# Patient Record
Sex: Male | Born: 1960 | Race: White | Hispanic: No | Marital: Married | Smoking: Former smoker
Health system: Southern US, Community
[De-identification: ages and names within clinical notes are randomized; demographics above are authoritative.]

## PROBLEM LIST (undated history)

## (undated) DIAGNOSIS — F419 Anxiety disorder, unspecified: Secondary | ICD-10-CM

## (undated) DIAGNOSIS — Z8619 Personal history of other infectious and parasitic diseases: Secondary | ICD-10-CM

## (undated) DIAGNOSIS — K219 Gastro-esophageal reflux disease without esophagitis: Secondary | ICD-10-CM

## (undated) DIAGNOSIS — E079 Disorder of thyroid, unspecified: Secondary | ICD-10-CM

## (undated) DIAGNOSIS — I10 Essential (primary) hypertension: Secondary | ICD-10-CM

## (undated) DIAGNOSIS — F32A Depression, unspecified: Secondary | ICD-10-CM

## (undated) DIAGNOSIS — E785 Hyperlipidemia, unspecified: Secondary | ICD-10-CM

## (undated) DIAGNOSIS — F329 Major depressive disorder, single episode, unspecified: Secondary | ICD-10-CM

## (undated) DIAGNOSIS — E119 Type 2 diabetes mellitus without complications: Secondary | ICD-10-CM

## (undated) HISTORY — DX: Anxiety disorder, unspecified: F41.9

## (undated) HISTORY — DX: Personal history of other infectious and parasitic diseases: Z86.19

## (undated) HISTORY — DX: Gastro-esophageal reflux disease without esophagitis: K21.9

## (undated) HISTORY — PX: ESOPHAGEAL DILATION: SHX303

## (undated) HISTORY — DX: Depression, unspecified: F32.A

## (undated) HISTORY — DX: Disorder of thyroid, unspecified: E07.9

## (undated) HISTORY — DX: Type 2 diabetes mellitus without complications: E11.9

## (undated) HISTORY — DX: Hyperlipidemia, unspecified: E78.5

## (undated) HISTORY — DX: Essential (primary) hypertension: I10

## (undated) HISTORY — DX: Major depressive disorder, single episode, unspecified: F32.9

---

## 2016-05-03 ENCOUNTER — Other Ambulatory Visit: Payer: Self-pay | Admitting: Internal Medicine

## 2016-05-03 DIAGNOSIS — E041 Nontoxic single thyroid nodule: Secondary | ICD-10-CM

## 2016-05-03 DIAGNOSIS — Z808 Family history of malignant neoplasm of other organs or systems: Secondary | ICD-10-CM

## 2016-05-03 DIAGNOSIS — E049 Nontoxic goiter, unspecified: Secondary | ICD-10-CM

## 2016-05-13 ENCOUNTER — Ambulatory Visit
Admission: RE | Admit: 2016-05-13 | Discharge: 2016-05-13 | Disposition: A | Discharge status: Home / Self Care | Payer: BLUE CROSS/BLUE SHIELD | Source: Ambulatory Visit | Attending: Internal Medicine | Admitting: Internal Medicine

## 2016-05-13 DIAGNOSIS — E041 Nontoxic single thyroid nodule: Secondary | ICD-10-CM

## 2016-05-13 DIAGNOSIS — E049 Nontoxic goiter, unspecified: Secondary | ICD-10-CM

## 2016-05-13 DIAGNOSIS — Z808 Family history of malignant neoplasm of other organs or systems: Secondary | ICD-10-CM

## 2016-05-22 ENCOUNTER — Other Ambulatory Visit: Payer: Self-pay | Admitting: Internal Medicine

## 2016-05-22 DIAGNOSIS — E041 Nontoxic single thyroid nodule: Secondary | ICD-10-CM

## 2016-07-03 ENCOUNTER — Other Ambulatory Visit (HOSPITAL_COMMUNITY)
Admission: RE | Admit: 2016-07-03 | Discharge: 2016-07-03 | Disposition: A | Discharge status: Home / Self Care | Payer: BLUE CROSS/BLUE SHIELD | Source: Ambulatory Visit | Attending: Interventional Radiology | Admitting: Interventional Radiology

## 2016-07-03 ENCOUNTER — Ambulatory Visit
Admission: RE | Admit: 2016-07-03 | Discharge: 2016-07-03 | Disposition: A | Discharge status: Home / Self Care | Payer: BLUE CROSS/BLUE SHIELD | Source: Ambulatory Visit | Attending: Internal Medicine | Admitting: Internal Medicine

## 2016-07-03 DIAGNOSIS — E041 Nontoxic single thyroid nodule: Secondary | ICD-10-CM | POA: Insufficient documentation

## 2016-09-04 DIAGNOSIS — E7800 Pure hypercholesterolemia, unspecified: Secondary | ICD-10-CM | POA: Diagnosis not present

## 2016-09-04 DIAGNOSIS — Z79899 Other long term (current) drug therapy: Secondary | ICD-10-CM | POA: Diagnosis not present

## 2016-09-04 DIAGNOSIS — R7303 Prediabetes: Secondary | ICD-10-CM | POA: Diagnosis not present

## 2016-09-04 DIAGNOSIS — R300 Dysuria: Secondary | ICD-10-CM | POA: Diagnosis not present

## 2016-09-04 DIAGNOSIS — E559 Vitamin D deficiency, unspecified: Secondary | ICD-10-CM | POA: Diagnosis not present

## 2016-10-28 DIAGNOSIS — K219 Gastro-esophageal reflux disease without esophagitis: Secondary | ICD-10-CM | POA: Diagnosis not present

## 2016-10-28 DIAGNOSIS — R1310 Dysphagia, unspecified: Secondary | ICD-10-CM | POA: Diagnosis not present

## 2016-11-07 DIAGNOSIS — K269 Duodenal ulcer, unspecified as acute or chronic, without hemorrhage or perforation: Secondary | ICD-10-CM | POA: Diagnosis not present

## 2016-11-07 DIAGNOSIS — K222 Esophageal obstruction: Secondary | ICD-10-CM | POA: Diagnosis not present

## 2016-11-07 DIAGNOSIS — R1310 Dysphagia, unspecified: Secondary | ICD-10-CM | POA: Diagnosis not present

## 2016-11-07 DIAGNOSIS — K2930 Chronic superficial gastritis without bleeding: Secondary | ICD-10-CM | POA: Diagnosis not present

## 2016-11-07 DIAGNOSIS — K2900 Acute gastritis without bleeding: Secondary | ICD-10-CM | POA: Diagnosis not present

## 2016-11-07 DIAGNOSIS — K210 Gastro-esophageal reflux disease with esophagitis: Secondary | ICD-10-CM | POA: Diagnosis not present

## 2016-11-07 DIAGNOSIS — K2980 Duodenitis without bleeding: Secondary | ICD-10-CM | POA: Diagnosis not present

## 2016-11-12 DIAGNOSIS — K210 Gastro-esophageal reflux disease with esophagitis: Secondary | ICD-10-CM | POA: Diagnosis not present

## 2016-11-12 DIAGNOSIS — K2980 Duodenitis without bleeding: Secondary | ICD-10-CM | POA: Diagnosis not present

## 2016-11-12 DIAGNOSIS — K2930 Chronic superficial gastritis without bleeding: Secondary | ICD-10-CM | POA: Diagnosis not present

## 2016-11-26 DIAGNOSIS — E7800 Pure hypercholesterolemia, unspecified: Secondary | ICD-10-CM | POA: Diagnosis not present

## 2016-11-26 DIAGNOSIS — R7303 Prediabetes: Secondary | ICD-10-CM | POA: Diagnosis not present

## 2016-11-26 DIAGNOSIS — I10 Essential (primary) hypertension: Secondary | ICD-10-CM | POA: Diagnosis not present

## 2016-11-26 DIAGNOSIS — E559 Vitamin D deficiency, unspecified: Secondary | ICD-10-CM | POA: Diagnosis not present

## 2016-11-26 DIAGNOSIS — E041 Nontoxic single thyroid nodule: Secondary | ICD-10-CM | POA: Diagnosis not present

## 2016-12-10 DIAGNOSIS — K210 Gastro-esophageal reflux disease with esophagitis: Secondary | ICD-10-CM | POA: Diagnosis not present

## 2016-12-10 DIAGNOSIS — K222 Esophageal obstruction: Secondary | ICD-10-CM | POA: Diagnosis not present

## 2016-12-13 DIAGNOSIS — R319 Hematuria, unspecified: Secondary | ICD-10-CM | POA: Diagnosis not present

## 2016-12-16 DIAGNOSIS — I711 Thoracic aortic aneurysm, ruptured: Secondary | ICD-10-CM | POA: Diagnosis not present

## 2017-02-11 DIAGNOSIS — K1230 Oral mucositis (ulcerative), unspecified: Secondary | ICD-10-CM | POA: Diagnosis not present

## 2017-02-11 DIAGNOSIS — Z7289 Other problems related to lifestyle: Secondary | ICD-10-CM | POA: Diagnosis not present

## 2017-02-11 DIAGNOSIS — K219 Gastro-esophageal reflux disease without esophagitis: Secondary | ICD-10-CM | POA: Insufficient documentation

## 2017-02-11 DIAGNOSIS — D1039 Benign neoplasm of other parts of mouth: Secondary | ICD-10-CM | POA: Insufficient documentation

## 2017-02-11 DIAGNOSIS — Z87891 Personal history of nicotine dependence: Secondary | ICD-10-CM | POA: Diagnosis not present

## 2017-03-26 DIAGNOSIS — Z0000 Encounter for general adult medical examination without abnormal findings: Secondary | ICD-10-CM | POA: Diagnosis not present

## 2017-03-26 DIAGNOSIS — I10 Essential (primary) hypertension: Secondary | ICD-10-CM | POA: Diagnosis not present

## 2017-03-26 DIAGNOSIS — R5383 Other fatigue: Secondary | ICD-10-CM | POA: Diagnosis not present

## 2017-03-26 DIAGNOSIS — E559 Vitamin D deficiency, unspecified: Secondary | ICD-10-CM | POA: Diagnosis not present

## 2017-03-26 DIAGNOSIS — R0602 Shortness of breath: Secondary | ICD-10-CM | POA: Diagnosis not present

## 2017-03-26 DIAGNOSIS — R7303 Prediabetes: Secondary | ICD-10-CM | POA: Diagnosis not present

## 2017-04-10 DIAGNOSIS — R945 Abnormal results of liver function studies: Secondary | ICD-10-CM | POA: Diagnosis not present

## 2017-04-10 DIAGNOSIS — E041 Nontoxic single thyroid nodule: Secondary | ICD-10-CM | POA: Diagnosis not present

## 2017-04-10 DIAGNOSIS — I10 Essential (primary) hypertension: Secondary | ICD-10-CM | POA: Diagnosis not present

## 2017-04-10 DIAGNOSIS — E559 Vitamin D deficiency, unspecified: Secondary | ICD-10-CM | POA: Diagnosis not present

## 2017-05-05 DIAGNOSIS — E7800 Pure hypercholesterolemia, unspecified: Secondary | ICD-10-CM | POA: Diagnosis not present

## 2017-05-05 DIAGNOSIS — R319 Hematuria, unspecified: Secondary | ICD-10-CM | POA: Diagnosis not present

## 2017-05-05 DIAGNOSIS — R7303 Prediabetes: Secondary | ICD-10-CM | POA: Diagnosis not present

## 2017-05-05 DIAGNOSIS — I10 Essential (primary) hypertension: Secondary | ICD-10-CM | POA: Diagnosis not present

## 2018-06-20 DIAGNOSIS — Z79899 Other long term (current) drug therapy: Secondary | ICD-10-CM | POA: Diagnosis not present

## 2018-06-20 DIAGNOSIS — S0990XA Unspecified injury of head, initial encounter: Secondary | ICD-10-CM | POA: Diagnosis not present

## 2018-06-20 DIAGNOSIS — Y998 Other external cause status: Secondary | ICD-10-CM | POA: Diagnosis not present

## 2018-06-20 DIAGNOSIS — Y929 Unspecified place or not applicable: Secondary | ICD-10-CM | POA: Diagnosis not present

## 2018-06-20 DIAGNOSIS — I10 Essential (primary) hypertension: Secondary | ICD-10-CM | POA: Diagnosis not present

## 2018-06-20 DIAGNOSIS — Z202 Contact with and (suspected) exposure to infections with a predominantly sexual mode of transmission: Secondary | ICD-10-CM | POA: Diagnosis not present

## 2018-06-20 DIAGNOSIS — Z87891 Personal history of nicotine dependence: Secondary | ICD-10-CM | POA: Diagnosis not present

## 2018-06-22 DIAGNOSIS — Z5329 Procedure and treatment not carried out because of patient's decision for other reasons: Secondary | ICD-10-CM | POA: Diagnosis not present

## 2018-06-22 DIAGNOSIS — Z043 Encounter for examination and observation following other accident: Secondary | ICD-10-CM | POA: Diagnosis not present

## 2018-07-02 ENCOUNTER — Encounter: Payer: Self-pay | Admitting: Physician Assistant

## 2018-07-02 ENCOUNTER — Other Ambulatory Visit: Payer: Self-pay

## 2018-07-02 ENCOUNTER — Ambulatory Visit (INDEPENDENT_AMBULATORY_CARE_PROVIDER_SITE_OTHER): Payer: BLUE CROSS/BLUE SHIELD | Admitting: Physician Assistant

## 2018-07-02 VITALS — BP 130/80 | HR 81 | Temp 98.4°F | Resp 16 | Ht 72.0 in | Wt 229.0 lb

## 2018-07-02 DIAGNOSIS — Z86010 Personal history of colonic polyps: Secondary | ICD-10-CM | POA: Diagnosis not present

## 2018-07-02 DIAGNOSIS — M25562 Pain in left knee: Secondary | ICD-10-CM | POA: Diagnosis not present

## 2018-07-02 DIAGNOSIS — I10 Essential (primary) hypertension: Secondary | ICD-10-CM | POA: Insufficient documentation

## 2018-07-02 MED ORDER — CARVEDILOL 25 MG PO TABS: 25.0000 mg | ORAL_TABLET | Freq: Two times a day (BID) | ORAL | 1 refills | Status: DC

## 2018-07-02 MED ORDER — AMLODIPINE BESYLATE 10 MG PO TABS: 10.0000 mg | ORAL_TABLET | Freq: Every day | ORAL | 1 refills | Status: DC

## 2018-07-02 NOTE — Assessment & Plan Note (Signed)
Colonoscopy up to date

## 2018-07-02 NOTE — Progress Notes (Signed)
Patient presents to clinic today to establish care.  Acute Concerns: Patient with remote history of L MCL tear (15 years ago), endorses 3 months of intermittent medial L knee pain, worse with prolonged ambulation. Notes occurred after slightly twisting his knee. Is alleviated with rest. Denies swelling, deformity, numbness or tingling. Has not taken anything for symptoms. Is wearing his old knee brace which has been helping.  Chronic Issues: Anxiety and Depression -- Currently on a regimen of Lexapro 20 mg daily. Is taking as directed. Uses Xanax prn for social anxiety (has to give presentations at work). 1 Rx lasts him up to a year.  Hypertension -- Was previously on lisinopril-HCTZ for several years. Notes traveling 3 weeks out of the month for work. Was in [place] last week for work and was feeling lightheaded and felthis BP was elevated. They have a doctor at the office and so he had BP checked and noted to be elevated at 200/100. Was sent to the hospital for further assessment. Notes Cardiology was consulted and workup unremarkable. Was taken off of lisinopril-HCTZ and started on combination of carvedilol and amlodipine which he endorses taking as directed. Is tolerating well and noting BP are normotensive.Marland Kitchen  Health Maintenance: Immunizations -- Will get records. Declines flu shot today. Colonoscopy -- Last in 12/2015 per patient. Polyps found - non-cancerous. Recall is 5 years.   Past Medical History:  Diagnosis Date  . Anxiety   . Depression   . GERD (gastroesophageal reflux disease)   . Hyperlipidemia   . Hypertension     Past Surgical History:  Procedure Laterality Date  . ESOPHAGEAL DILATION      Current Outpatient Medications on File Prior to Visit  Medication Sig Dispense Refill  . ALPRAZolam (XANAX) 0.25 MG tablet Take 0.25 mg by mouth 2 (two) times daily as needed for anxiety.    Marland Kitchen aspirin 81 MG tablet Take 81 mg by mouth daily.    Marland Kitchen escitalopram (LEXAPRO) 20  MG tablet Take 20 mg by mouth daily.    Marland Kitchen omeprazole (PRILOSEC OTC) 20 MG tablet Take 20 mg by mouth daily.     No current facility-administered medications on file prior to visit.     No Known Allergies  Family History  Problem Relation Age of Onset  . Diabetes Mother   . Hypertension Mother   . Heart attack Mother   . Aneurysm Father        Died at 60  . Cancer Father        Bladder  . Cancer Sister        Thyroid cancer    Social History   Socioeconomic History  . Marital status: Married    Spouse name: Not on file  . Number of children: Not on file  . Years of education: Not on file  . Highest education level: Not on file  Occupational History  . Not on file  Social Needs  . Financial resource strain: Not on file  . Food insecurity:    Worry: Not on file    Inability: Not on file  . Transportation needs:    Medical: Not on file    Non-medical: Not on file  Tobacco Use  . Smoking status: Former Research scientist (life sciences)  . Smokeless tobacco: Never Used  Substance and Sexual Activity  . Alcohol use: Yes  . Drug use: Not Currently  . Sexual activity: Yes  Lifestyle  . Physical activity:    Days per week: Not on file  Minutes per session: Not on file  . Stress: Not on file  Relationships  . Social connections:    Talks on phone: Not on file    Gets together: Not on file    Attends religious service: Not on file    Active member of club or organization: Not on file    Attends meetings of clubs or organizations: Not on file    Relationship status: Not on file  . Intimate partner violence:    Fear of current or ex partner: Not on file    Emotionally abused: Not on file    Physically abused: Not on file    Forced sexual activity: Not on file  Other Topics Concern  . Not on file  Social History Narrative  . Not on file   Review of Systems  Constitutional: Negative for fever.  Eyes: Negative for blurred vision and double vision.  Cardiovascular: Negative for chest  pain and palpitations.  Neurological: Negative for dizziness and loss of consciousness.   BP 130/80   Pulse 81   Temp 98.4 F (36.9 C) (Oral)   Resp 16   Ht 6' (1.829 m)   Wt 229 lb (103.9 kg)   SpO2 98%   BMI 31.06 kg/m   Physical Exam  Constitutional: He is oriented to person, place, and time. He appears well-developed and well-nourished.  HENT:  Head: Normocephalic and atraumatic.  Eyes: Conjunctivae and EOM are normal.  Neck: Neck supple.  Cardiovascular: Normal rate, regular rhythm, normal heart sounds and intact distal pulses.  Pulmonary/Chest: Effort normal and breath sounds normal. No stridor. No respiratory distress. He has no wheezes. He has no rales. He exhibits no tenderness.  Musculoskeletal:       Left knee: He exhibits normal range of motion and no swelling. Tenderness found. Medial joint line and MCL tenderness noted. No lateral joint line, no LCL and no patellar tendon tenderness noted.  Neurological: He is alert and oriented to person, place, and time.  Psychiatric: He has a normal mood and affect.  Vitals reviewed.  Assessment/Plan: Essential hypertension BP normotensive. Asymptomatic. Continue current regimen. Will have him return for CPE in the next 2-3 weeks. Will obtain fasting labs at that time.   History of colon polyps Colonoscopy up-to-date.  Medial knee pain, left MCL sprain most likely. Patient with hx of tear. Bracing recommended. Elevation and ICE discussed. Will refer to Sports Medicine giving recurring and chronic symptoms.    Leeanne Rio, PA-C

## 2018-07-02 NOTE — Assessment & Plan Note (Signed)
BP normotensive. Asymptomatic. Continue current regimen. Will have him return for CPE in the next 2-3 weeks. Will obtain fasting labs at that time.

## 2018-07-02 NOTE — Patient Instructions (Signed)
BP looks much improved today from your prior readings.  Continue current medication regimen. I have sent in refills.  Try to keep a low salt diet -- see information below.  Continue wearing your knee brace. Ice and elevate while resting. Alternate Tylenol and Ibuprofen for pain if needed. I am setting you up with our sports medicine doctor.  Follow-up in 2-3 weeks for a complete physical.  It was very nice meeting you today. Welcome to [facility]!   DASH Eating Plan DASH stands for "Dietary Approaches to Stop Hypertension." The DASH eating plan is a healthy eating plan that has been shown to reduce high blood pressure (hypertension). It may also reduce your risk for type 2 diabetes, heart disease, and stroke. The DASH eating plan may also help with weight loss. What are tips for following this plan? General guidelines  Avoid eating more than 2,300 mg (milligrams) of salt (sodium) a day. If you have hypertension, you may need to reduce your sodium intake to 1,500 mg a day.  Limit alcohol intake to no more than 1 drink a day for nonpregnant women and 2 drinks a day for men. One drink equals 12 oz of beer, 5 oz of wine, or 1 oz of hard liquor.  Work with your health care provider to maintain a healthy body weight or to lose weight. Ask what an ideal weight is for you.  Get at least 30 minutes of exercise that causes your heart to beat faster (aerobic exercise) most days of the week. Activities may include walking, swimming, or biking.  Work with your health care provider or diet and nutrition specialist (dietitian) to adjust your eating plan to your individual calorie needs. Reading food labels  Check food labels for the amount of sodium per serving. Choose foods with less than 5 percent of the Daily Value of sodium. Generally, foods with less than 300 mg of sodium per serving fit into this eating plan.  To find whole grains, look for the word "whole" as the first word in the ingredient  list. Shopping  Buy products labeled as "low-sodium" or "no salt added."  Buy fresh foods. Avoid canned foods and premade or frozen meals. Cooking  Avoid adding salt when cooking. Use salt-free seasonings or herbs instead of table salt or sea salt. Check with your health care provider or pharmacist before using salt substitutes.  Do not fry foods. Cook foods using healthy methods such as baking, boiling, grilling, and broiling instead.  Cook with heart-healthy oils, such as olive, canola, soybean, or sunflower oil. Meal planning   Eat a balanced diet that includes: ? 5 or more servings of fruits and vegetables each day. At each meal, try to fill half of your plate with fruits and vegetables. ? Up to 6-8 servings of whole grains each day. ? Less than 6 oz of lean meat, poultry, or fish each day. A 3-oz serving of meat is about the same size as a deck of cards. One egg equals 1 oz. ? 2 servings of low-fat dairy each day. ? A serving of nuts, seeds, or beans 5 times each week. ? Heart-healthy fats. Healthy fats called Omega-3 fatty acids are found in foods such as flaxseeds and coldwater fish, like sardines, salmon, and mackerel.  Limit how much you eat of the following: ? Canned or prepackaged foods. ? Food that is high in trans fat, such as fried foods. ? Food that is high in saturated fat, such as fatty meat. ? Sweets,  desserts, sugary drinks, and other foods with added sugar. ? Full-fat dairy products.  Do not salt foods before eating.  Try to eat at least 2 vegetarian meals each week.  Eat more home-cooked food and less restaurant, buffet, and fast food.  When eating at a restaurant, ask that your food be prepared with less salt or no salt, if possible. What foods are recommended? The items listed may not be a complete list. Talk with your dietitian about what dietary choices are best for you. Grains Whole-grain or whole-wheat bread. Whole-grain or whole-wheat pasta. Brown  rice. [place]. Bulgur. Whole-grain and low-sodium cereals. Pita bread. Low-fat, low-sodium crackers. Whole-wheat flour tortillas. Vegetables Fresh or frozen vegetables (raw, steamed, roasted, or grilled). Low-sodium or reduced-sodium tomato and vegetable juice. Low-sodium or reduced-sodium tomato sauce and tomato paste. Low-sodium or reduced-sodium canned vegetables. Fruits All fresh, dried, or frozen fruit. Canned fruit in natural juice (without added sugar). Meat and other protein foods Skinless chicken or [place]. Ground chicken or [place]. Pork with fat trimmed off. Fish and seafood. Egg whites. Dried beans, peas, or lentils. Unsalted nuts, nut butters, and seeds. Unsalted canned beans. Lean cuts of beef with fat trimmed off. Low-sodium, lean deli meat. Dairy Low-fat (1%) or fat-free (skim) milk. Fat-free, low-fat, or reduced-fat cheeses. Nonfat, low-sodium ricotta or cottage cheese. Low-fat or nonfat yogurt. Low-fat, low-sodium cheese. Fats and oils Soft margarine without trans fats. Vegetable oil. Low-fat, reduced-fat, or light mayonnaise and salad dressings (reduced-sodium). Canola, safflower, olive, soybean, and sunflower oils. Avocado. Seasoning and other foods Herbs. Spices. Seasoning mixes without salt. Unsalted popcorn and pretzels. Fat-free sweets. What foods are not recommended? The items listed may not be a complete list. Talk with your dietitian about what dietary choices are best for you. Grains Baked goods made with fat, such as croissants, muffins, or some breads. Dry pasta or rice meal packs. Vegetables Creamed or fried vegetables. Vegetables in a cheese sauce. Regular canned vegetables (not low-sodium or reduced-sodium). Regular canned tomato sauce and paste (not low-sodium or reduced-sodium). Regular tomato and vegetable juice (not low-sodium or reduced-sodium). Angie Fava. Olives. Fruits Canned fruit in a light or heavy syrup. Fried fruit. Fruit in cream or butter  sauce. Meat and other protein foods Fatty cuts of meat. Ribs. Fried meat. Berniece Salines. Sausage. Bologna and other processed lunch meats. Salami. Fatback. Hotdogs. Bratwurst. Salted nuts and seeds. Canned beans with added salt. Canned or smoked fish. Whole eggs or egg yolks. Chicken or [place] with skin. Dairy Whole or 2% milk, cream, and half-and-half. Whole or full-fat cream cheese. Whole-fat or sweetened yogurt. Full-fat cheese. Nondairy creamers. Whipped toppings. Processed cheese and cheese spreads. Fats and oils Butter. Stick margarine. Lard. Shortening. Ghee. Bacon fat. Tropical oils, such as coconut, palm kernel, or palm oil. Seasoning and other foods Salted popcorn and pretzels. Onion salt, garlic salt, seasoned salt, table salt, and sea salt. Worcestershire sauce. Tartar sauce. Barbecue sauce. Teriyaki sauce. Soy sauce, including reduced-sodium. Steak sauce. Canned and packaged gravies. Fish sauce. Oyster sauce. Cocktail sauce. Horseradish that you find on the shelf. Ketchup. Mustard. Meat flavorings and tenderizers. Bouillon cubes. Hot sauce and Tabasco sauce. Premade or packaged marinades. Premade or packaged taco seasonings. Relishes. Regular salad dressings. Where to find more information:  National Heart, Lung, and [place]: https://wilson-eaton.com/  American Heart Association: www.heart.org Summary  The DASH eating plan is a healthy eating plan that has been shown to reduce high blood pressure (hypertension). It may also reduce your risk for type 2 diabetes, heart  disease, and stroke.  With the DASH eating plan, you should limit salt (sodium) intake to 2,300 mg a day. If you have hypertension, you may need to reduce your sodium intake to 1,500 mg a day.  When on the DASH eating plan, aim to eat more fresh fruits and vegetables, whole grains, lean proteins, low-fat dairy, and heart-healthy fats.  Work with your health care provider or diet and nutrition specialist (dietitian) to adjust  your eating plan to your individual calorie needs. This information is not intended to replace advice given to you by your health care provider. Make sure you discuss any questions you have with your health care provider. Document Released: 10/17/2011 Document Revised: 10/21/2016 Document Reviewed: 10/21/2016 Elsevier Interactive Patient Education  [employer].

## 2018-07-02 NOTE — Assessment & Plan Note (Signed)
MCL sprain most likely. Patient with hx of tear. Bracing recommended. Elevation and ICE discussed. Will refer to Sports Medicine giving recurring and chronic symptoms.

## 2018-07-14 ENCOUNTER — Ambulatory Visit (INDEPENDENT_AMBULATORY_CARE_PROVIDER_SITE_OTHER): Payer: BLUE CROSS/BLUE SHIELD | Admitting: Sports Medicine

## 2018-07-14 ENCOUNTER — Encounter: Payer: Self-pay | Admitting: Sports Medicine

## 2018-07-14 ENCOUNTER — Ambulatory Visit: Payer: Self-pay

## 2018-07-14 ENCOUNTER — Ambulatory Visit (INDEPENDENT_AMBULATORY_CARE_PROVIDER_SITE_OTHER): Payer: BLUE CROSS/BLUE SHIELD

## 2018-07-14 VITALS — BP 138/80 | HR 90 | Ht 72.0 in | Wt 235.0 lb

## 2018-07-14 DIAGNOSIS — G8929 Other chronic pain: Secondary | ICD-10-CM

## 2018-07-14 DIAGNOSIS — M1712 Unilateral primary osteoarthritis, left knee: Secondary | ICD-10-CM | POA: Diagnosis not present

## 2018-07-14 DIAGNOSIS — M25562 Pain in left knee: Secondary | ICD-10-CM

## 2018-07-14 NOTE — Patient Instructions (Signed)

## 2018-07-14 NOTE — Progress Notes (Signed)
Dustin Black. Dustin Black, [place] [facility] [place]  Dustin Black - 57 y.o. male MRN [id number]  Date of birth: 06-03-1961  Visit Date: 07/14/2018  PCP: Brunetta Jeans, PA-C   Referred by: Brunetta Jeans, PA-C  Scribe(s) for today's visit: Wendy Poet, LAT, ATC  SUBJECTIVE:  Dustin Black is here for New Patient (Initial Visit) (L medial knee pain) .  Referred by: Elyn Aquas, PA  HPI: His L medial knee pain symptoms INITIALLY: Began about 4-5 months when he twisted his L knee.  He stepped to get up on a ladder and heard a pop in his L knee.  He has a prior hx of a L MCL tear. Described as mild, tight-type pain that is worst after activity, nonradiating Worsened with prolonged ambulation and w/ prolonged sitting Improved with rest and repositioning his leg Additional associated symptoms include: no swelling and no mechanical symptoms noted in the L knee.    At this time symptoms show no change compared to onset. He has been wearing a knee brace.  REVIEW OF SYSTEMS: Denies night time disturbances. Denies fevers, chills, or night sweats. Denies unexplained weight loss. Denies personal history of cancer. Denies changes in bowel or bladder habits. Denies recent unreported falls. Denies new or worsening dyspnea or wheezing. Reports headaches or dizziness. Yes to dizziness but due to high BP. Denies numbness, tingling or weakness  In the extremities.  Reports dizziness or presyncopal episodes Denies lower extremity edema     HISTORY:  Prior history reviewed and updated per electronic medical record.  Social History   Occupational History  . Not on file  Tobacco Use  . Smoking status: Former Research scientist (life sciences)  . Smokeless tobacco: Never Used  Substance and Sexual Activity  . Alcohol use: Yes  . Drug use: Not Currently  . Sexual activity: Yes   Social History   Social History Narrative  . Not on file    Past Medical  History:  Diagnosis Date  . Anxiety   . Depression   . GERD (gastroesophageal reflux disease)   . History of chickenpox   . Hyperlipidemia   . Hypertension    Past Surgical History:  Procedure Laterality Date  . ESOPHAGEAL DILATION     family history includes Aneurysm in his father; Cancer in his father and sister; Diabetes in his mother; Heart attack in his mother; Hypertension in his mother.  DATA OBTAINED & REVIEWED:   Recent Labs    07/16/18 0841  HGBA1C 5.9   . 07/14/2018: Left knee x-rays 1. Mild to slightly moderate left patellofemoral joint degenerative changes.  2. Minimal left medial tibiofemoral joint space narrowing.  3. Possible small left suprapatellar joint effusion.  .   OBJECTIVE:  VS:  HT:6' (182.9 cm)   WT:235 lb (106.6 kg)  BMI:31.86    BP:138/80  HR:90bpm  TEMP: ( )  RESP:94 %   PHYSICAL EXAM: CONSTITUTIONAL: Well-developed, Well-nourished and In no acute distress PSYCHIATRIC: Alert & appropriately interactive. and Not depressed or anxious appearing. RESPIRATORY: No increased work of breathing and Trachea Midline EYES: Pupils are equal., EOM intact without nystagmus. and No scleral icterus.  VASCULAR EXAM: Warm and well perfused NEURO: unremarkable  MSK Exam: Left knee  Well aligned, no significant deformity. No overlying skin changes. TTP over Medial and lateral joint lines.   RANGE OF MOTION & STRENGTH  Extensor mechanism intact.  Full knee range of motion.   SPECIALITY TESTING:  Pain  with McMurray's, Thessaly's positive.  No appreciable clicking.  Positive patellar grind        ASSESSMENT   1. Chronic pain of left knee   2. Medial knee pain, left     PLAN:  Pertinent additional documentation may be included in corresponding procedure notes, imaging studies, problem based documentation and patient instructions.  Procedures:  . US Guided Injection per procedure note  Medications:  No orders of the defined types were placed  in this encounter.  Discussion/Instructions: No problem-specific Assessment & Plan notes found for this encounter.  . Likely degenerative meniscal tear peer . Discussed red flag symptoms that warrant earlier emergent evaluation and patient voices understanding. . Activity modifications and the importance of avoiding exacerbating activities (limiting pain to no more than a 4 / 10 during or following activity) recommended and discussed.  Follow-up:  . Return in about 6 weeks (around 08/25/2018) for repeat clinical exam.   . If any lack of improvement consider:   . further diagnostic evaluation with MRI  . At follow up will plan to consider: repeat corticosteroid injections     CMA/ATC served as scribe during this visit. History, Physical, and Plan performed by medical provider. Documentation and orders reviewed and attested to.      Gerda Diss, [facility] Physician

## 2018-07-16 ENCOUNTER — Encounter: Payer: Self-pay | Admitting: Physician Assistant

## 2018-07-16 ENCOUNTER — Ambulatory Visit (INDEPENDENT_AMBULATORY_CARE_PROVIDER_SITE_OTHER): Payer: BLUE CROSS/BLUE SHIELD | Admitting: Physician Assistant

## 2018-07-16 ENCOUNTER — Other Ambulatory Visit: Payer: Self-pay

## 2018-07-16 VITALS — BP 124/88 | HR 101 | Temp 98.6°F | Resp 16 | Ht 72.0 in | Wt 227.0 lb

## 2018-07-16 DIAGNOSIS — Z0000 Encounter for general adult medical examination without abnormal findings: Secondary | ICD-10-CM | POA: Diagnosis not present

## 2018-07-16 DIAGNOSIS — Z125 Encounter for screening for malignant neoplasm of prostate: Secondary | ICD-10-CM

## 2018-07-16 DIAGNOSIS — I10 Essential (primary) hypertension: Secondary | ICD-10-CM | POA: Diagnosis not present

## 2018-07-16 LAB — PSA: PSA: 0.84 ng/mL (ref 0.10–4.00)

## 2018-07-16 LAB — HEMOGLOBIN A1C: Hgb A1c MFr Bld: 5.9 % (ref 4.6–6.5)

## 2018-07-16 LAB — COMPREHENSIVE METABOLIC PANEL
ALT: 70 U/L — ABNORMAL HIGH (ref 0–53)
AST: 73 U/L — ABNORMAL HIGH (ref 0–37)
Albumin: 4.5 g/dL (ref 3.5–5.2)
Alkaline Phosphatase: 65 U/L (ref 39–117)
BUN: 10 mg/dL (ref 6–23)
CO2: 29 mEq/L (ref 19–32)
Calcium: 10.2 mg/dL (ref 8.4–10.5)
Chloride: 100 mEq/L (ref 96–112)
Creatinine, Ser: 0.88 mg/dL (ref 0.40–1.50)
GFR: 94.78 mL/min (ref >60.00)
Glucose, Bld: 95 mg/dL (ref 70–99)
Potassium: 3.8 mEq/L (ref 3.5–5.1)
Sodium: 142 mEq/L (ref 135–145)
Total Bilirubin: 0.6 mg/dL (ref 0.2–1.2)
Total Protein: 7.7 g/dL (ref 6.0–8.3)

## 2018-07-16 LAB — CBC WITH DIFFERENTIAL/PLATELET
Basophils Absolute: 0 10*3/uL (ref 0.0–0.1)
Basophils Relative: 0.2 % (ref 0.0–3.0)
Eosinophils Absolute: 0 10*3/uL (ref 0.0–0.7)
Eosinophils Relative: 0 % (ref 0.0–5.0)
HCT: 44.2 % (ref 39.0–52.0)
Hemoglobin: 14.7 g/dL (ref 13.0–17.0)
Lymphocytes Relative: 11.7 % — ABNORMAL LOW (ref 12.0–46.0)
Lymphs Abs: 1.5 10*3/uL (ref 0.7–4.0)
MCHC: 33.2 g/dL (ref 30.0–36.0)
MCV: 93.1 fl (ref 78.0–100.0)
Monocytes Absolute: 1.1 10*3/uL — ABNORMAL HIGH (ref 0.1–1.0)
Monocytes Relative: 8.4 % (ref 3.0–12.0)
Neutro Abs: 10.1 10*3/uL — ABNORMAL HIGH (ref 1.4–7.7)
Neutrophils Relative %: 79.7 % — ABNORMAL HIGH (ref 43.0–77.0)
Platelets: 271 10*3/uL (ref 150.0–400.0)
RBC: 4.75 Mil/uL (ref 4.22–5.81)
RDW: 14.3 % (ref 11.5–15.5)
WBC: 12.7 10*3/uL — ABNORMAL HIGH (ref 4.0–10.5)

## 2018-07-16 LAB — LIPID PANEL
Cholesterol: 253 mg/dL — ABNORMAL HIGH (ref 0–200)
HDL: 92.5 mg/dL (ref >39.00)
LDL Cholesterol: 151 mg/dL — ABNORMAL HIGH (ref 0–99)
NonHDL: 160.47
Total CHOL/HDL Ratio: 3
Triglycerides: 48 mg/dL (ref 0.0–149.0)
VLDL: 9.6 mg/dL (ref 0.0–40.0)

## 2018-07-16 NOTE — Assessment & Plan Note (Signed)
Depression screen negative. Health Maintenance reviewed. Preventive schedule discussed and handout given in AVS. Will obtain fasting labs today.  

## 2018-07-16 NOTE — Assessment & Plan Note (Signed)
Stable. Continue current regimen. Fasting labs today to include lipids.

## 2018-07-16 NOTE — Patient Instructions (Signed)
-Please go to the lab for blood work.  -Our office will call you with your results unless you have chosen to receive results via MyChart. -If your blood work is normal we will follow-up each year for physicals and as scheduled for chronic medical problems. -If anything is abnormal we will treat accordingly and get you in for a follow-up.  -Continue medications as directed. -Schedule an appointment for an annual eye examination.    Preventive Care 40-64 Years, Male Preventive care refers to lifestyle choices and visits with your health care provider that can promote health and wellness. What does preventive care include?  A yearly physical exam. This is also called an annual well check.  Dental exams once or twice a year.  Routine eye exams. Ask your health care provider how often you should have your eyes checked.  Personal lifestyle choices, including: ? Daily care of your teeth and gums. ? Regular physical activity. ? Eating a healthy diet. ? Avoiding tobacco and drug use. ? Limiting alcohol use. ? Practicing safe sex. ? Taking low-dose aspirin every day starting at age 79. What happens during an annual well check? The services and screenings done by your health care provider during your annual well check will depend on your age, overall health, lifestyle risk factors, and family history of disease. Counseling Your health care provider may ask you questions about your:  Alcohol use.  Tobacco use.  Drug use.  Emotional well-being.  Home and relationship well-being.  Sexual activity.  Eating habits.  Work and work Statistician.  Screening You may have the following tests or measurements:  Height, weight, and BMI.  Blood pressure.  Lipid and cholesterol levels. These may be checked every 5 years, or more frequently if you are over 71 years old.  Skin check.  Lung cancer screening. You may have this screening every year starting at age 18 if you have a  30-pack-year history of smoking and currently smoke or have quit within the past 15 years.  Fecal occult blood test (FOBT) of the stool. You may have this test every year starting at age 59.  Flexible sigmoidoscopy or colonoscopy. You may have a sigmoidoscopy every 5 years or a colonoscopy every 10 years starting at age 87.  Prostate cancer screening. Recommendations will vary depending on your family history and other risks.  Hepatitis C blood test.  Hepatitis B blood test.  Sexually transmitted disease (STD) testing.  Diabetes screening. This is done by checking your blood sugar (glucose) after you have not eaten for a while (fasting). You may have this done every 1-3 years.  Discuss your test results, treatment options, and if necessary, the need for more tests with your health care provider. Vaccines Your health care provider may recommend certain vaccines, such as:  Influenza vaccine. This is recommended every year.  Tetanus, diphtheria, and acellular pertussis (Tdap, Td) vaccine. You may need a Td booster every 10 years.  Varicella vaccine. You may need this if you have not been vaccinated.  Zoster vaccine. You may need this after age 85.  Measles, mumps, and rubella (MMR) vaccine. You may need at least one dose of MMR if you were born in 1957 or later. You may also need a second dose.  Pneumococcal 13-valent conjugate (PCV13) vaccine. You may need this if you have certain conditions and have not been vaccinated.  Pneumococcal polysaccharide (PPSV23) vaccine. You may need one or two doses if you smoke cigarettes or if you have certain conditions.  Meningococcal vaccine. You may need this if you have certain conditions.  Hepatitis A vaccine. You may need this if you have certain conditions or if you travel or work in places where you may be exposed to hepatitis A.  Hepatitis B vaccine. You may need this if you have certain conditions or if you travel or work in places where  you may be exposed to hepatitis B.  Haemophilus influenzae type b (Hib) vaccine. You may need this if you have certain risk factors.  Talk to your health care provider about which screenings and vaccines you need and how often you need them. This information is not intended to replace advice given to you by your health care provider. Make sure you discuss any questions you have with your health care provider. Document Released: 11/24/2015 Document Revised: 07/17/2016 Document Reviewed: 08/29/2015 Elsevier Interactive Patient Education  [employer].

## 2018-07-16 NOTE — Progress Notes (Signed)
Patient presents to clinic today for annual exam.  Patient is fasting for labs. Patient denies acute concerns today. Has history of hypertension for which he takes his amlodipine and Carvedilol. Patient denies chest pain, palpitations, lightheadedness, dizziness, vision changes or frequent headaches.  BP Readings from Last 3 Encounters:  07/16/18 124/88  07/14/18 138/80  07/02/18 130/80   Health Maintenance: Immunizations -- Endorses TDaP within past 2 years. Awaiting records to verify exact date. Will abstract into chart. Declines flu shot today. Colonoscopy -- up-to-date.   Past Medical History:  Diagnosis Date  . Anxiety   . Depression   . GERD (gastroesophageal reflux disease)   . History of chickenpox   . Hyperlipidemia   . Hypertension     Past Surgical History:  Procedure Laterality Date  . ESOPHAGEAL DILATION      Current Outpatient Medications on File Prior to Visit  Medication Sig Dispense Refill  . ALPRAZolam (XANAX) 0.25 MG tablet Take 0.25 mg by mouth 2 (two) times daily as needed for anxiety.    Marland Kitchen amLODipine (NORVASC) 10 MG tablet Take 1 tablet (10 mg total) by mouth daily. 90 tablet 1  . aspirin 81 MG tablet Take 81 mg by mouth daily.    . carvedilol (COREG) 25 MG tablet Take 1 tablet (25 mg total) by mouth 2 (two) times daily with a meal. 180 tablet 1  . escitalopram (LEXAPRO) 20 MG tablet Take 20 mg by mouth daily.    Marland Kitchen omeprazole (PRILOSEC OTC) 20 MG tablet Take 20 mg by mouth daily.     No current facility-administered medications on file prior to visit.     No Known Allergies  Family History  Problem Relation Age of Onset  . Diabetes Mother   . Hypertension Mother   . Heart attack Mother   . Aneurysm Father        Died at 49  . Cancer Father        Bladder  . Cancer Sister        Thyroid cancer    Social History   Socioeconomic History  . Marital status: Married    Spouse name: Not on file  . Number of children: Not on file  .  Years of education: Not on file  . Highest education level: Not on file  Occupational History  . Not on file  Social Needs  . Financial resource strain: Not on file  . Food insecurity:    Worry: Not on file    Inability: Not on file  . Transportation needs:    Medical: Not on file    Non-medical: Not on file  Tobacco Use  . Smoking status: Former Research scientist (life sciences)  . Smokeless tobacco: Never Used  Substance and Sexual Activity  . Alcohol use: Yes  . Drug use: Not Currently  . Sexual activity: Yes  Lifestyle  . Physical activity:    Days per week: Not on file    Minutes per session: Not on file  . Stress: Not on file  Relationships  . Social connections:    Talks on phone: Not on file    Gets together: Not on file    Attends religious service: Not on file    Active member of club or organization: Not on file    Attends meetings of clubs or organizations: Not on file    Relationship status: Not on file  . Intimate partner violence:    Fear of current or ex partner: Not on  file    Emotionally abused: Not on file    Physically abused: Not on file    Forced sexual activity: Not on file  Other Topics Concern  . Not on file  Social History Narrative  . Not on file   Review of Systems  Constitutional: Negative for fever and weight loss.  HENT: Negative for ear discharge, ear pain, hearing loss and tinnitus.   Eyes: Negative for blurred vision, double vision, photophobia and pain.  Respiratory: Negative for cough and shortness of breath.   Cardiovascular: Negative for chest pain and palpitations.  Gastrointestinal: Negative for abdominal pain, blood in stool, constipation, diarrhea, heartburn, melena, nausea and vomiting.  Genitourinary: Negative for dysuria, flank pain, frequency, hematuria and urgency.  Musculoskeletal: Negative for falls.  Neurological: Negative for dizziness, loss of consciousness and headaches.  Endo/Heme/Allergies: Negative for environmental allergies.    Psychiatric/Behavioral: Negative for depression, hallucinations, substance abuse and suicidal ideas. The patient is not nervous/anxious and does not have insomnia.    BP 124/88   Pulse (!) 101   Temp 98.6 F (37 C) (Oral)   Resp 16   Ht 6' (1.829 m)   Wt 227 lb (103 kg)   SpO2 98%   BMI 30.79 kg/m   Physical Exam  Constitutional: He is oriented to person, place, and time. He appears well-developed and well-nourished. No distress.  HENT:  Head: Normocephalic and atraumatic.  Right Ear: Tympanic membrane, external ear and ear canal normal.  Left Ear: Tympanic membrane, external ear and ear canal normal.  Nose: Nose normal.  Mouth/Throat: Oropharynx is clear and moist and mucous membranes are normal. No posterior oropharyngeal edema or posterior oropharyngeal erythema.  Eyes: Pupils are equal, round, and reactive to light. Conjunctivae are normal.  Neck: Neck supple. No thyromegaly present.  Cardiovascular: Normal rate, regular rhythm, normal heart sounds and intact distal pulses.  Pulmonary/Chest: Effort normal and breath sounds normal. No respiratory distress. He has no wheezes. He has no rales. He exhibits no tenderness.  Abdominal: Soft. Bowel sounds are normal. He exhibits no distension and no mass. There is no tenderness. There is no rebound and no guarding.  Lymphadenopathy:    He has no cervical adenopathy.  Neurological: He is alert and oriented to person, place, and time. No cranial nerve deficit.  Skin: Skin is warm and dry. No rash noted. He is not diaphoretic.  Psychiatric: He has a normal mood and affect.  Vitals reviewed.  Assessment/Plan: Visit for preventive health examination Depression screen negative. Health Maintenance reviewed. Preventive schedule discussed and handout given in AVS. Will obtain fasting labs today.   Prostate cancer screening The natural history of prostate cancer and ongoing controversy regarding screening and potential treatment  outcomes of prostate cancer has been discussed with the patient. The meaning of a false positive PSA and a false negative PSA has been discussed. He indicates understanding of the limitations of this screening test and wishes to proceed with screening PSA testing.   Essential hypertension Stable. Continue current regimen. Fasting labs today to include lipids.    Leeanne Rio, PA-C

## 2018-07-16 NOTE — Assessment & Plan Note (Signed)
The natural history of prostate cancer and ongoing controversy regarding screening and potential treatment outcomes of prostate cancer has been discussed with the patient. The meaning of a false positive PSA and a false negative PSA has been discussed. He indicates understanding of the limitations of this screening test and wishes  to proceed with screening PSA testing.  

## 2018-07-20 ENCOUNTER — Other Ambulatory Visit: Payer: Self-pay

## 2018-07-20 DIAGNOSIS — R945 Abnormal results of liver function studies: Principal | ICD-10-CM

## 2018-07-20 DIAGNOSIS — D72828 Other elevated white blood cell count: Secondary | ICD-10-CM

## 2018-07-20 DIAGNOSIS — R7989 Other specified abnormal findings of blood chemistry: Secondary | ICD-10-CM

## 2018-07-28 ENCOUNTER — Encounter: Payer: Self-pay | Admitting: Sports Medicine

## 2018-07-28 NOTE — Procedures (Signed)
PROCEDURE NOTE:  Ultrasound Guided: Injection: Left knee Images were obtained and interpreted by myself, Teresa Coombs, DO  Images have been saved and stored to PACS system. Images obtained on: GE S7 Ultrasound machine    ULTRASOUND FINDINGS:  Small supraphysiologic effusion. Degenerative meniscal changes appreciated medially.  DESCRIPTION OF PROCEDURE:  The patient's clinical condition is marked by substantial pain and/or significant functional disability. Other conservative therapy has not provided relief, is contraindicated, or not appropriate. There is a reasonable likelihood that injection will significantly improve the patient's pain and/or functional impairment.   After discussing the risks, benefits and expected outcomes of the injection and all questions were reviewed and answered, the patient wished to undergo the above named procedure.  Verbal consent was obtained.  The ultrasound was used to identify the target structure and adjacent neurovascular structures. The skin was then prepped in sterile fashion and the target structure was injected under direct visualization using sterile technique as below:  Single injection performed as below: PREP: Alcohol and Ethel Chloride APPROACH:superiolateral, single injection, 25g 1.5 in. INJECTATE: 2 cc 0.5% Marcaine and 2 cc 40mg /mL DepoMedrol ASPIRATE: None DRESSING: Band-Aid  Post procedural instructions including recommending icing and warning signs for infection were reviewed.    This procedure was well tolerated and there were no complications.   IMPRESSION: Succesful Ultrasound Guided: Injection

## 2018-08-11 ENCOUNTER — Other Ambulatory Visit: Payer: BLUE CROSS/BLUE SHIELD

## 2018-08-25 ENCOUNTER — Ambulatory Visit (INDEPENDENT_AMBULATORY_CARE_PROVIDER_SITE_OTHER): Payer: BLUE CROSS/BLUE SHIELD | Admitting: Sports Medicine

## 2018-08-25 ENCOUNTER — Encounter: Payer: Self-pay | Admitting: Sports Medicine

## 2018-08-25 VITALS — BP 140/86 | HR 85 | Ht 72.0 in | Wt 239.2 lb

## 2018-08-25 DIAGNOSIS — G8929 Other chronic pain: Secondary | ICD-10-CM

## 2018-08-25 DIAGNOSIS — M25562 Pain in left knee: Secondary | ICD-10-CM | POA: Diagnosis not present

## 2018-08-25 NOTE — Progress Notes (Signed)

## 2018-08-25 NOTE — Patient Instructions (Signed)
Please perform the exercise program that we have prepared for you and gone over in detail on a daily basis.  In addition to the handout you were provided you can access your program through: [email or web address]   Your unique program code is: [facility]

## 2018-08-25 NOTE — Progress Notes (Signed)
Dustin Black, Dustin Black  Sheryl Towell - 57 y.o. male MRN [id number]  Date of birth: October 26, 1961  Visit Date: 08/25/2018  PCP: Brunetta Jeans, PA-C   Referred by: Brunetta Jeans, PA-C  Scribe(s) for today's visit: Wendy Poet, LAT, ATC  SUBJECTIVE:  Dustin Black is here for Follow-up (L knee pain) .  Referred by: Elyn Aquas, PA  HPI: His L medial knee pain symptoms INITIALLY: Began about 4-5 months when he twisted his L knee.  He stepped to get up on a ladder and heard a pop in his L knee.  He has a prior hx of a L MCL tear. Described as mild, tight-type pain that is worst after activity, nonradiating Worsened with prolonged ambulation and w/ prolonged sitting Improved with rest and repositioning his leg Additional associated symptoms include: no swelling and no mechanical symptoms noted in the L knee.    At this time symptoms show no change compared to onset. He has been wearing a knee brace.  08/25/2018: Compared to the last office visit on 07/14/18, his previously described L knee symptoms are improving.  He notes that he will get some "tingling" at the L medial knee if he's been doing a lot of physical labor/yard work.  He feels about 90-95% improved. Current symptoms are mild & are nonradiating He has not needed his knee brace recently.  He had a steroid injection on 07/14/18 which was very helpful.  L knee XR - 07/14/18  REVIEW OF SYSTEMS: Denies night time disturbances. Denies fevers, chills, or night sweats. Denies unexplained weight loss. Denies personal history of cancer. Denies changes in bowel or bladder habits. Denies recent unreported falls. Denies new or worsening dyspnea or wheezing. Denies headaches or dizziness.  Denies numbness, tingling or weakness  In the extremities.  Denies dizziness or presyncopal episodes Denies lower extremity edema     HISTORY:  Prior history reviewed  and updated per electronic medical record.  Social History   Occupational History  . Not on file  Tobacco Use  . Smoking status: Former Research scientist (life sciences)  . Smokeless tobacco: Never Used  Substance and Sexual Activity  . Alcohol use: Yes  . Drug use: Not Currently  . Sexual activity: Yes   Social History   Social History Narrative  . Not on file    Past Medical History:  Diagnosis Date  . Anxiety   . Depression   . GERD (gastroesophageal reflux disease)   . History of chickenpox   . Hyperlipidemia   . Hypertension    Past Surgical History:  Procedure Laterality Date  . ESOPHAGEAL DILATION     family history includes Aneurysm in his father; Cancer in his father and sister; Diabetes in his mother; Heart attack in his mother; Hypertension in his mother.  DATA OBTAINED & REVIEWED:   Recent Labs    07/16/18 0841  HGBA1C 5.9   . 07/14/2018: Left knee x-rays 1. Mild to slightly moderate left patellofemoral joint degenerative changes.  2. Minimal left medial tibiofemoral joint space narrowing.  3. Possible small left suprapatellar joint effusion.  .   OBJECTIVE:  VS:  HT:6' (182.9 cm)   WT:239 lb 3.2 oz (108.5 kg)  BMI:32.43    BP:140/86  HR:85bpm  TEMP: ( )  RESP:96 %   PHYSICAL EXAM: CONSTITUTIONAL: Well-developed, Well-nourished and In no acute distress PSYCHIATRIC: Alert & appropriately interactive. and Not depressed or anxious appearing. RESPIRATORY: No increased  work of breathing and Trachea Midline EYES: Pupils are equal., EOM intact without nystagmus. and No scleral icterus.  VASCULAR EXAM: Warm and well perfused NEURO: unremarkable  MSK Exam: Left knee  Well aligned, no significant deformity. No overlying skin changes. TTP over Medial and lateral joint lines.   RANGE OF MOTION & STRENGTH  Extensor mechanism intact.  Full knee range of motion.   SPECIALITY TESTING:  no pain with McMurray's, normal/painfe  Thessaly.  No appreciable clicking.  Positive  patellar grind     ASSESSMENT   1. Medial knee pain, left   2. Chronic pain of left knee     PLAN:  Pertinent additional documentation may be included in corresponding procedure notes, imaging studies, problem based documentation and patient instructions.  Procedures:  . Discussed the foundation of treatment for this condition is physical therapy and/or daily (5-6 days/week) therapeutic exercises, focusing on core strengthening, coordination, neuromuscular control/reeducation.  Therapeutic exercises prescribed per procedure note.  Medications:  No orders of the defined types were placed in this encounter.  Discussion/Instructions: No problem-specific Assessment & Plan notes found for this encounter.  . 90 to 95% improvement following injection.  Discussed avoidance of exacerbating activities and recommended strengthening of quadriceps hip abductors for improved neuromuscular control. . Discussed red flag symptoms that warrant earlier emergent evaluation and patient voices understanding. . Activity modifications and the importance of avoiding exacerbating activities (limiting pain to no more than a 4 / 10 during or following activity) recommended and discussed.  Follow-up:  . Return if symptoms worsen or fail to improve.   . If any lack of improvement consider: further diagnostic evaluation with MRI  . At follow up will plan to consider: repeat corticosteroid injections     CMA/ATC served as scribe during this visit. History, Physical, and Plan performed by medical provider. Documentation and orders reviewed and attested to.      Gerda Diss, [facility] Physician

## 2018-08-26 NOTE — Progress Notes (Signed)
Patient is scheduled for lab visit in 11/19 for recheck labs

## 2018-09-14 ENCOUNTER — Other Ambulatory Visit: Payer: BLUE CROSS/BLUE SHIELD

## 2018-12-30 ENCOUNTER — Other Ambulatory Visit: Payer: Self-pay | Admitting: Physician Assistant

## 2018-12-30 DIAGNOSIS — I10 Essential (primary) hypertension: Secondary | ICD-10-CM

## 2019-05-02 ENCOUNTER — Emergency Department (HOSPITAL_COMMUNITY): Payer: BC Managed Care – PPO

## 2019-05-02 ENCOUNTER — Encounter (HOSPITAL_COMMUNITY): Payer: Self-pay | Admitting: Emergency Medicine

## 2019-05-02 ENCOUNTER — Emergency Department (HOSPITAL_COMMUNITY)
Admission: EM | Admit: 2019-05-02 | Discharge: 2019-05-02 | Disposition: A | Discharge status: Home / Self Care | Payer: BC Managed Care – PPO | Attending: Emergency Medicine | Admitting: Emergency Medicine

## 2019-05-02 ENCOUNTER — Other Ambulatory Visit: Payer: Self-pay

## 2019-05-02 DIAGNOSIS — I10 Essential (primary) hypertension: Secondary | ICD-10-CM | POA: Diagnosis not present

## 2019-05-02 DIAGNOSIS — S0990XA Unspecified injury of head, initial encounter: Secondary | ICD-10-CM | POA: Diagnosis not present

## 2019-05-02 DIAGNOSIS — E871 Hypo-osmolality and hyponatremia: Secondary | ICD-10-CM | POA: Diagnosis not present

## 2019-05-02 DIAGNOSIS — Z7982 Long term (current) use of aspirin: Secondary | ICD-10-CM | POA: Insufficient documentation

## 2019-05-02 DIAGNOSIS — Z87891 Personal history of nicotine dependence: Secondary | ICD-10-CM | POA: Insufficient documentation

## 2019-05-02 DIAGNOSIS — R079 Chest pain, unspecified: Secondary | ICD-10-CM | POA: Diagnosis not present

## 2019-05-02 DIAGNOSIS — Z79899 Other long term (current) drug therapy: Secondary | ICD-10-CM | POA: Diagnosis not present

## 2019-05-02 DIAGNOSIS — R42 Dizziness and giddiness: Secondary | ICD-10-CM | POA: Insufficient documentation

## 2019-05-02 DIAGNOSIS — K449 Diaphragmatic hernia without obstruction or gangrene: Secondary | ICD-10-CM | POA: Diagnosis not present

## 2019-05-02 LAB — URINALYSIS, ROUTINE W REFLEX MICROSCOPIC
Bacteria, UA: NONE SEEN
Bilirubin Urine: NEGATIVE
Glucose, UA: NEGATIVE mg/dL
Hgb urine dipstick: NEGATIVE
Ketones, ur: 5 mg/dL — AB
Nitrite: NEGATIVE
Protein, ur: NEGATIVE mg/dL
Specific Gravity, Urine: 1.005 (ref 1.005–1.030)
pH: 7 (ref 5.0–8.0)

## 2019-05-02 LAB — COMPREHENSIVE METABOLIC PANEL
ALT: 125 U/L — ABNORMAL HIGH (ref 0–44)
AST: 106 U/L — ABNORMAL HIGH (ref 15–41)
Albumin: 4.1 g/dL (ref 3.5–5.0)
Alkaline Phosphatase: 68 U/L (ref 38–126)
Anion gap: 15 (ref 5–15)
BUN: 14 mg/dL (ref 6–20)
CO2: 26 mmol/L (ref 22–32)
Calcium: 12.9 mg/dL — ABNORMAL HIGH (ref 8.9–10.3)
Chloride: 90 mmol/L — ABNORMAL LOW (ref 98–111)
Creatinine, Ser: 1.27 mg/dL — ABNORMAL HIGH (ref 0.61–1.24)
GFR calc Af Amer: >60 mL/min (ref >60)
GFR calc non Af Amer: >60 mL/min (ref >60)
Glucose, Bld: 137 mg/dL — ABNORMAL HIGH (ref 70–99)
Potassium: 3.2 mmol/L — ABNORMAL LOW (ref 3.5–5.1)
Sodium: 131 mmol/L — ABNORMAL LOW (ref 135–145)
Total Bilirubin: 2.1 mg/dL — ABNORMAL HIGH (ref 0.3–1.2)
Total Protein: 7.7 g/dL (ref 6.5–8.1)

## 2019-05-02 LAB — PROTIME-INR
INR: 1.1 (ref 0.8–1.2)
Prothrombin Time: 13.6 seconds (ref 11.4–15.2)

## 2019-05-02 LAB — CBC
HCT: 47.6 % (ref 39.0–52.0)
Hemoglobin: 16.7 g/dL (ref 13.0–17.0)
MCH: 30.9 pg (ref 26.0–34.0)
MCHC: 35.1 g/dL (ref 30.0–36.0)
MCV: 88.1 fL (ref 80.0–100.0)
Platelets: 179 10*3/uL (ref 150–400)
RBC: 5.4 MIL/uL (ref 4.22–5.81)
RDW: 14 % (ref 11.5–15.5)
WBC: 14.1 10*3/uL — ABNORMAL HIGH (ref 4.0–10.5)
nRBC: 0 % (ref 0.0–0.2)

## 2019-05-02 LAB — DIFFERENTIAL
Abs Immature Granulocytes: 0.07 10*3/uL (ref 0.00–0.07)
Basophils Absolute: 0 10*3/uL (ref 0.0–0.1)
Basophils Relative: 0 %
Eosinophils Absolute: 0 10*3/uL (ref 0.0–0.5)
Eosinophils Relative: 0 %
Immature Granulocytes: 1 %
Lymphocytes Relative: 8 %
Lymphs Abs: 1.1 10*3/uL (ref 0.7–4.0)
Monocytes Absolute: 1.5 10*3/uL — ABNORMAL HIGH (ref 0.1–1.0)
Monocytes Relative: 11 %
Neutro Abs: 11.4 10*3/uL — ABNORMAL HIGH (ref 1.7–7.7)
Neutrophils Relative %: 80 %

## 2019-05-02 LAB — D-DIMER, QUANTITATIVE: D-Dimer, Quant: 2.37 ug/mL-FEU — ABNORMAL HIGH (ref 0.00–0.50)

## 2019-05-02 LAB — RAPID URINE DRUG SCREEN, HOSP PERFORMED
Amphetamines: NOT DETECTED
Barbiturates: NOT DETECTED
Benzodiazepines: NOT DETECTED
Cocaine: NOT DETECTED
Opiates: NOT DETECTED
Tetrahydrocannabinol: NOT DETECTED

## 2019-05-02 LAB — ETHANOL: Alcohol, Ethyl (B): <10 mg/dL (ref <10)

## 2019-05-02 LAB — APTT: aPTT: 24 seconds (ref 24–36)

## 2019-05-02 LAB — TROPONIN I: Troponin I: <0.03 ng/mL (ref <0.03)

## 2019-05-02 MED ORDER — CHLORDIAZEPOXIDE HCL 25 MG PO CAPS: ORAL_CAPSULE | ORAL | 0 refills | Status: DC

## 2019-05-02 MED ORDER — MECLIZINE HCL 25 MG PO TABS
25.0000 mg | ORAL_TABLET | Freq: Once | ORAL | Status: AC
  Administered 2019-05-02: 25 mg via ORAL
  Filled 2019-05-02: qty 1

## 2019-05-02 MED ORDER — MECLIZINE HCL 25 MG PO TABS: 25.0000 mg | ORAL_TABLET | Freq: Three times a day (TID) | ORAL | 0 refills | Status: DC | PRN

## 2019-05-02 MED ORDER — SODIUM CHLORIDE 0.9 % IV BOLUS
1000.0000 mL | Freq: Once | INTRAVENOUS | Status: AC
  Administered 2019-05-02: 1000 mL via INTRAVENOUS

## 2019-05-02 MED ORDER — VITAMIN B-1 100 MG PO TABS: 100.0000 mg | ORAL_TABLET | Freq: Every day | ORAL | 0 refills | Status: DC

## 2019-05-02 MED ORDER — IOHEXOL 350 MG/ML SOLN
100.0000 mL | Freq: Once | INTRAVENOUS | Status: AC | PRN
  Administered 2019-05-02: 15:00:00 100 mL via INTRAVENOUS

## 2019-05-02 MED ORDER — POTASSIUM CHLORIDE CRYS ER 20 MEQ PO TBCR
40.0000 meq | EXTENDED_RELEASE_TABLET | Freq: Once | ORAL | Status: AC
  Administered 2019-05-02: 40 meq via ORAL
  Filled 2019-05-02: qty 2

## 2019-05-02 NOTE — ED Notes (Signed)
Pt provided phone and instructed how to call wife.

## 2019-05-02 NOTE — ED Triage Notes (Signed)
Pt arrives to ED from home with complaints of dizziness and balance issues since yesterday 6/20 when he awoke from sleep. Pt denies any pain but continues to have a headache and trouble standing up and walking today.

## 2019-05-02 NOTE — ED Notes (Signed)
Called lab to add-on trop and d-dimer

## 2019-05-02 NOTE — ED Provider Notes (Signed)
Patient care assumed at 1130.  Pt here for evaluation of dizziness, had a syncopal episode three days ago. He complains of gradual onset headache that began today as well as unsteadiness with ambulation. He also has indigestion. Labs and MRI are pending.    MRI with chronic changes. Labs significant for mild hyponatremia as well as of elevation in his transaminases. Troponin is negative. D dimer was elevated and CTA was obtained. CT is negative for PE. Discussed with patient findings of studies, feel that dehydration may be contributing to symptoms. He was treated with IV fluids. He is requesting medication to help with alcohol detox, will prescribe Librium. Discussed importance of PCP follow-up as well as return precautions.   Quintella Reichert, MD 05/02/19 [phone]

## 2019-05-02 NOTE — ED Notes (Signed)
Pt has called his wife to update her.

## 2019-05-02 NOTE — Discharge Instructions (Addendum)
You had a CT scan of your chest that showed calcifications of the arteries in your chest as well as fatty changes to your liver - please follow up with your family doctor for recheck. Get rechecked immediately if you have any new or concerning symptoms.

## 2019-05-02 NOTE — ED Provider Notes (Addendum)
[place] EMERGENCY DEPARTMENT Provider Note   CSN: [id number] Arrival date & time: 05/02/19  1001    History   Chief Complaint Chief Complaint  Patient presents with  . Dizziness    HPI Dustin Black is a 58 y.o. male.     HPI Patient reports early in the morning hours of Thursday he got up to go to the bathroom and passed out.  He reports he landed on his bed and did not feel injured.  The next morning on Friday when he awakened he felt all right.  He reports Friday he did not have any symptoms.  He reports on Saturday when he got up he felt dizzy.  He reports the quality of dizziness is feeling like he is off balance and has to catch things to steady himself.  He cannot determine which direction seems to be more announced.  He reports today he continues to have some trouble walking and also now has a dull headache.  No vomiting, no visual changes, no blurred vision or double vision.  No fever.  Patient denies he is been sick recently.  He reports he does drink about 5 beers per evening.  He denies he is had alcohol withdrawal in the past.  Reports last drink was 3 days ago. Past Medical History:  Diagnosis Date  . Anxiety   . Depression   . GERD (gastroesophageal reflux disease)   . History of chickenpox   . Hyperlipidemia   . Hypertension     Patient Active Problem List   Diagnosis Date Noted  . Visit for preventive health examination 07/16/2018  . Prostate cancer screening 07/16/2018  . History of colon polyps 07/02/2018  . Medial knee pain, left 07/02/2018  . Essential hypertension 07/02/2018  . Gastroesophageal reflux disease 02/11/2017    Past Surgical History:  Procedure Laterality Date  . ESOPHAGEAL DILATION          Home Medications    Prior to Admission medications   Medication Sig Start Date End Date Taking? Authorizing Provider  ALPRAZolam (XANAX) 0.25 MG tablet Take 0.25 mg by mouth 2 (two) times daily as needed for anxiety.     [provider]  amLODipine (NORVASC) 10 MG tablet TAKE 1 TABLET BY MOUTH EVERY DAY 12/30/18   Brunetta Jeans, PA-C  aspirin 81 MG tablet Take 81 mg by mouth daily.    [provider]  carvedilol (COREG) 25 MG tablet TAKE 1 TABLET (25 MG TOTAL) BY MOUTH 2 (TWO) TIMES DAILY WITH A MEAL. 12/30/18   Brunetta Jeans, PA-C  escitalopram (LEXAPRO) 20 MG tablet Take 20 mg by mouth daily.    [provider]  omeprazole (PRILOSEC OTC) 20 MG tablet Take 20 mg by mouth daily.    [provider]    Family History Family History  Problem Relation Age of Onset  . Diabetes Mother   . Hypertension Mother   . Heart attack Mother   . Aneurysm Father        Died at 27  . Cancer Father        Bladder  . Cancer Sister        Thyroid cancer    Social History Social History   Tobacco Use  . Smoking status: Former Research scientist (life sciences)  . Smokeless tobacco: Never Used  Substance Use Topics  . Alcohol use: Yes  . Drug use: Not Currently     Allergies   Patient has no known allergies.  Review of Systems Review of Systems 10 Systems reviewed and are negative for acute change except as noted in the HPI.   Physical Exam Updated Vital Signs BP (!) 141/116   Pulse 95   Temp 98.3 F (36.8 C) (Oral)   Resp 17   Ht 6' (1.829 m)   Wt 102.5 kg   SpO2 98%   BMI 30.65 kg/m   Physical Exam Constitutional:      Appearance: He is well-developed.  HENT:     Head: Normocephalic and atraumatic.     Mouth/Throat:     Mouth: Mucous membranes are moist.     Pharynx: Oropharynx is clear.  Eyes:     Extraocular Movements: Extraocular movements intact.     Pupils: Pupils are equal, round, and reactive to light.  Neck:     Musculoskeletal: Neck supple.  Cardiovascular:     Rate and Rhythm: Normal rate and regular rhythm.     Heart sounds: Normal heart sounds.  Pulmonary:     Effort: Pulmonary effort is normal.     Breath sounds: Normal breath sounds.  Abdominal:      General: Bowel sounds are normal. There is no distension.     Palpations: Abdomen is soft.     Tenderness: There is no abdominal tenderness.  Musculoskeletal: Normal range of motion.  Skin:    General: Skin is warm and dry.  Neurological:     General: No focal deficit present.     Mental Status: He is alert and oriented to person, place, and time.     GCS: GCS eye subscore is 4. GCS verbal subscore is 5. GCS motor subscore is 6.     Cranial Nerves: No cranial nerve deficit.     Sensory: No sensory deficit.     Coordination: Coordination normal.  Psychiatric:        Mood and Affect: Mood normal.      ED Treatments / Results  Labs (all labs ordered are listed, but only abnormal results are displayed) Labs Reviewed  CBC - Abnormal; Notable for the following components:      Result Value   WBC 14.1 (*)    All other components within normal limits  DIFFERENTIAL - Abnormal; Notable for the following components:   Neutro Abs 11.4 (*)    Monocytes Absolute 1.5 (*)    All other components within normal limits  COMPREHENSIVE METABOLIC PANEL - Abnormal; Notable for the following components:   Sodium 131 (*)    Potassium 3.2 (*)    Chloride 90 (*)    Glucose, Bld 137 (*)    Creatinine, Ser 1.27 (*)    Calcium 12.9 (*)    AST 106 (*)    ALT 125 (*)    Total Bilirubin 2.1 (*)    All other components within normal limits  URINALYSIS, ROUTINE W REFLEX MICROSCOPIC - Abnormal; Notable for the following components:   Ketones, ur 5 (*)    Leukocytes,Ua SMALL (*)    All other components within normal limits  ETHANOL  PROTIME-INR  APTT  RAPID URINE DRUG SCREEN, HOSP PERFORMED  TROPONIN I  D-DIMER, QUANTITATIVE (NOT AT [facility])    EKG EKG Interpretation  Date/Time:  Sunday May 02 2019 10:11:41 EDT Ventricular Rate:  100 PR Interval:    QRS Duration: 120 QT Interval:  376 QTC Calculation: 485 R Axis:   67 Text Interpretation:  Sinus tachycardia Probable left atrial enlargement  Nonspecific intraventricular conduction delay no prior available for  comparison Confirmed by Quintella Reichert [id number]) on 05/02/2019 12:00:50 PM   Radiology Dg Chest Port 1 View  Result Date: 05/02/2019 CLINICAL DATA:  Syncope chest pain dizziness EXAM: PORTABLE CHEST 1 VIEW COMPARISON:  None. FINDINGS: The heart size and mediastinal contours are within normal limits. Both lungs are clear. The visualized skeletal structures are unremarkable. IMPRESSION: No active disease. Electronically Signed   By: Franchot Gallo M.D.   On: 05/02/2019 12:38    Procedures Procedures (including critical care time)  Medications Ordered in ED Medications  sodium chloride 0.9 % bolus 1,000 mL (has no administration in time range)     Initial Impression / Assessment and Plan / ED Course  I have reviewed the triage vital signs and the nursing notes.  Pertinent labs & imaging results that were available during my care of the patient were reviewed by me and considered in my medical decision making (see chart for details).       Dr. Ralene Bathe will assume care for the patient.  Further diagnostic evaluation and disposition per her evaluation.  Final Clinical Impressions(s) / ED Diagnoses   Final diagnoses:  None    ED Discharge Orders    None       Charlesetta Shanks, MD 05/02/19 1305    Charlesetta Shanks, MD 05/02/19 1402

## 2019-05-02 NOTE — ED Notes (Signed)
Pt remains at MRI

## 2019-05-02 NOTE — ED Notes (Signed)
Patient transported to CT 

## 2019-05-02 NOTE — ED Notes (Signed)
Patient transported to MRI 

## 2019-05-02 NOTE — ED Notes (Signed)
Patient verbalizes understanding of discharge instructions. Opportunity for questioning and answers were provided. Armband removed by staff, pt discharged from ED.  

## 2019-05-21 ENCOUNTER — Ambulatory Visit (INDEPENDENT_AMBULATORY_CARE_PROVIDER_SITE_OTHER): Payer: BC Managed Care – PPO | Admitting: Physician Assistant

## 2019-05-21 ENCOUNTER — Encounter: Payer: Self-pay | Admitting: Physician Assistant

## 2019-05-21 ENCOUNTER — Other Ambulatory Visit: Payer: Self-pay

## 2019-05-21 DIAGNOSIS — Z7289 Other problems related to lifestyle: Secondary | ICD-10-CM

## 2019-05-21 DIAGNOSIS — F32A Depression, unspecified: Secondary | ICD-10-CM

## 2019-05-21 DIAGNOSIS — Z789 Other specified health status: Secondary | ICD-10-CM

## 2019-05-21 DIAGNOSIS — F419 Anxiety disorder, unspecified: Secondary | ICD-10-CM | POA: Diagnosis not present

## 2019-05-21 DIAGNOSIS — F329 Major depressive disorder, single episode, unspecified: Secondary | ICD-10-CM | POA: Diagnosis not present

## 2019-05-21 DIAGNOSIS — E860 Dehydration: Secondary | ICD-10-CM | POA: Diagnosis not present

## 2019-05-21 MED ORDER — ESCITALOPRAM OXALATE 20 MG PO TABS: 20.0000 mg | ORAL_TABLET | Freq: Every day | ORAL | 5 refills | Status: DC

## 2019-05-21 MED ORDER — SILDENAFIL CITRATE 20 MG PO TABS: ORAL_TABLET | ORAL | 0 refills | Status: DC

## 2019-05-21 NOTE — Progress Notes (Signed)
I have discussed the procedure for the virtual visit with the patient who has given consent to proceed with assessment and treatment.   [name] S [name], CMA     

## 2019-05-21 NOTE — Progress Notes (Signed)
Virtual Visit via Video   I connected with patient on 05/21/19 at  9:00 AM EDT by a video enabled telemedicine application and verified that I am speaking with the correct person using two identifiers.  Location patient: Home Location provider: [facility] Persons participating in the virtual visit: Patient, Provider, [place] (Patina Moore)  I discussed the limitations of evaluation and management by telemedicine and the availability of in person appointments. The patient expressed understanding and agreed to proceed.  Subjective:   HPI:   Patient presents today via [facility] for ER follow-up. Patient presented to ER on 05/02/2019 due to symptoms of dizziness, lightheadedness and syncope that started the day before. ER workup included labs (elevated WBC at 14, elevated transaminases, mild hyponatremia, negative troponin), CXR (negative), CTA (negative for PE) and MRI (negative for any acute intracranial abnormality; chronic white matter changes noted).  Was given IV fluids for hydration and sent home with 3 days of Librium in case of potential alcohol withdrawal.   Since discharge, patient endorses doing very well. Notes he has been staying well-hydrated. Keeping water and Gatorade handy. Is staying well nourished. Only took Librium once. No recurrence of symptoms. Is limiting alcohol to 1 drink max per night. Notes mood is doing very well with Lexapro daily. Is in need of refill.   ROS:   See pertinent positives and negatives per HPI.  Patient Active Problem List   Diagnosis Date Noted  . Visit for preventive health examination 07/16/2018  . Prostate cancer screening 07/16/2018  . History of colon polyps 07/02/2018  . Medial knee pain, left 07/02/2018  . Essential hypertension 07/02/2018  . Gastroesophageal reflux disease 02/11/2017    Social History   Tobacco Use  . Smoking status: Former Research scientist (life sciences)  . Smokeless tobacco: Never Used  Substance Use Topics  . Alcohol  use: Yes    Current Outpatient Medications:  .  amLODipine (NORVASC) 10 MG tablet, TAKE 1 TABLET BY MOUTH EVERY DAY, Disp: 90 tablet, Rfl: 1 .  aspirin 81 MG tablet, Take 81 mg by mouth daily., Disp: , Rfl:  .  carvedilol (COREG) 25 MG tablet, TAKE 1 TABLET (25 MG TOTAL) BY MOUTH 2 (TWO) TIMES DAILY WITH A MEAL., Disp: 180 tablet, Rfl: 1 .  escitalopram (LEXAPRO) 20 MG tablet, Take 20 mg by mouth daily., Disp: , Rfl:  .  omeprazole (PRILOSEC OTC) 20 MG tablet, Take 20 mg by mouth daily., Disp: , Rfl:  .  ALPRAZolam (XANAX) 0.25 MG tablet, Take 0.25 mg by mouth 2 (two) times daily as needed for anxiety., Disp: , Rfl:   No Known Allergies  Objective:   There were no vitals taken for this visit.  Patient is well-developed, well-nourished in no acute distress.  Resting comfortably at home.  Head is normocephalic, atraumatic.  No labored breathing.  Speech is clear and coherent with logical contest.  Patient is alert and oriented at baseline.   Assessment and Plan:   1. Dehydration Rehydrated in ER. Some increase in alcohol likely a contributing factor to dehydration. He is limiting to no more than 1 per night (see below). Is hydrating very well without recurrence of symptoms. Discussed need for repeat CBC and CMP. He is unable to this coming week but will come in for labs the following week. Orders placed future.  2. Alcohol use Cutting back to 1 per day. Recommend complete cessation giving liver function. He is going to work on this. Future labs placed. If LFTs not improved  will need to proceed with further liver assessment.   3. Anxiety and depression Stable. Continue current regimen. Medications refilled.     Leeanne Rio, PA-C 05/21/2019

## 2019-06-09 ENCOUNTER — Ambulatory Visit: Payer: BC Managed Care – PPO

## 2019-06-12 ENCOUNTER — Other Ambulatory Visit: Payer: Self-pay | Admitting: Physician Assistant

## 2019-06-23 ENCOUNTER — Other Ambulatory Visit: Payer: Self-pay | Admitting: Emergency Medicine

## 2019-06-23 ENCOUNTER — Other Ambulatory Visit: Payer: Self-pay

## 2019-06-23 ENCOUNTER — Ambulatory Visit (INDEPENDENT_AMBULATORY_CARE_PROVIDER_SITE_OTHER): Payer: BC Managed Care – PPO

## 2019-06-23 DIAGNOSIS — E860 Dehydration: Secondary | ICD-10-CM

## 2019-06-23 DIAGNOSIS — D72828 Other elevated white blood cell count: Secondary | ICD-10-CM | POA: Diagnosis not present

## 2019-06-23 LAB — COMPREHENSIVE METABOLIC PANEL
ALT: 31 U/L (ref 0–53)
AST: 23 U/L (ref 0–37)
Albumin: 4.2 g/dL (ref 3.5–5.2)
Alkaline Phosphatase: 77 U/L (ref 39–117)
BUN: 8 mg/dL (ref 6–23)
CO2: 26 mEq/L (ref 19–32)
Calcium: 9.4 mg/dL (ref 8.4–10.5)
Chloride: 100 mEq/L (ref 96–112)
Creatinine, Ser: 0.77 mg/dL (ref 0.40–1.50)
GFR: 103.69 mL/min (ref >60.00)
Glucose, Bld: 144 mg/dL — ABNORMAL HIGH (ref 70–99)
Potassium: 3.4 mEq/L — ABNORMAL LOW (ref 3.5–5.1)
Sodium: 138 mEq/L (ref 135–145)
Total Bilirubin: 0.6 mg/dL (ref 0.2–1.2)
Total Protein: 7.1 g/dL (ref 6.0–8.3)

## 2019-06-23 LAB — CBC WITH DIFFERENTIAL/PLATELET
Basophils Absolute: 0.1 10*3/uL (ref 0.0–0.1)
Basophils Relative: 0.5 % (ref 0.0–3.0)
Eosinophils Absolute: 0.1 10*3/uL (ref 0.0–0.7)
Eosinophils Relative: 0.7 % (ref 0.0–5.0)
HCT: 42.2 % (ref 39.0–52.0)
Hemoglobin: 14.2 g/dL (ref 13.0–17.0)
Lymphocytes Relative: 12.9 % (ref 12.0–46.0)
Lymphs Abs: 1.3 10*3/uL (ref 0.7–4.0)
MCHC: 33.7 g/dL (ref 30.0–36.0)
MCV: 95 fl (ref 78.0–100.0)
Monocytes Absolute: 0.7 10*3/uL (ref 0.1–1.0)
Monocytes Relative: 7.1 % (ref 3.0–12.0)
Neutro Abs: 7.7 10*3/uL (ref 1.4–7.7)
Neutrophils Relative %: 78.8 % — ABNORMAL HIGH (ref 43.0–77.0)
Platelets: 225 10*3/uL (ref 150.0–400.0)
RBC: 4.44 Mil/uL (ref 4.22–5.81)
RDW: 14.2 % (ref 11.5–15.5)
WBC: 9.7 10*3/uL (ref 4.0–10.5)

## 2019-07-05 ENCOUNTER — Other Ambulatory Visit: Payer: Self-pay | Admitting: Physician Assistant

## 2019-07-06 ENCOUNTER — Other Ambulatory Visit: Payer: Self-pay | Admitting: Physician Assistant

## 2019-07-06 ENCOUNTER — Encounter: Payer: Self-pay | Admitting: Emergency Medicine

## 2019-07-06 DIAGNOSIS — I10 Essential (primary) hypertension: Secondary | ICD-10-CM

## 2019-07-06 NOTE — Telephone Encounter (Signed)
Refills approved. He does need at least an in-office appt for BP check. His labs are UTD. Please have him schedule.

## 2019-07-06 NOTE — Telephone Encounter (Signed)
LMOVM advising patient labs are UTD and medications refilled. Dustin Black would like to schedule an in office blood pressure check.

## 2019-07-18 ENCOUNTER — Other Ambulatory Visit: Payer: Self-pay | Admitting: Physician Assistant

## 2019-08-12 ENCOUNTER — Other Ambulatory Visit: Payer: Self-pay | Admitting: Physician Assistant

## 2019-08-28 ENCOUNTER — Other Ambulatory Visit: Payer: Self-pay | Admitting: Physician Assistant

## 2019-09-06 ENCOUNTER — Other Ambulatory Visit: Payer: Self-pay | Admitting: Physician Assistant

## 2019-09-07 NOTE — Telephone Encounter (Signed)
Last refill: 10.19.20 #10, 1 Last OV: 7.10.20 dx. Dehydration, alcohol abuse, depression/ anxiety

## 2020-01-14 ENCOUNTER — Other Ambulatory Visit: Payer: Self-pay | Admitting: Physician Assistant

## 2020-01-14 DIAGNOSIS — I10 Essential (primary) hypertension: Secondary | ICD-10-CM

## 2020-01-28 ENCOUNTER — Other Ambulatory Visit: Payer: Self-pay | Admitting: Physician Assistant

## 2020-01-28 DIAGNOSIS — I10 Essential (primary) hypertension: Secondary | ICD-10-CM

## 2020-06-08 DIAGNOSIS — Z20822 Contact with and (suspected) exposure to covid-19: Secondary | ICD-10-CM | POA: Diagnosis not present

## 2020-08-09 IMAGING — DX DG KNEE AP/LAT W/ SUNRISE*L*
3 series · 3 of 3 positions shown · non-contrast
Comparison: None.

CLINICAL DATA: 57-year-old male with chronic left knee pain for 4-5
months when he twisted left knee. Initial encounter.

EXAM:
LEFT KNEE 3 VIEWS

[knee standing ap]
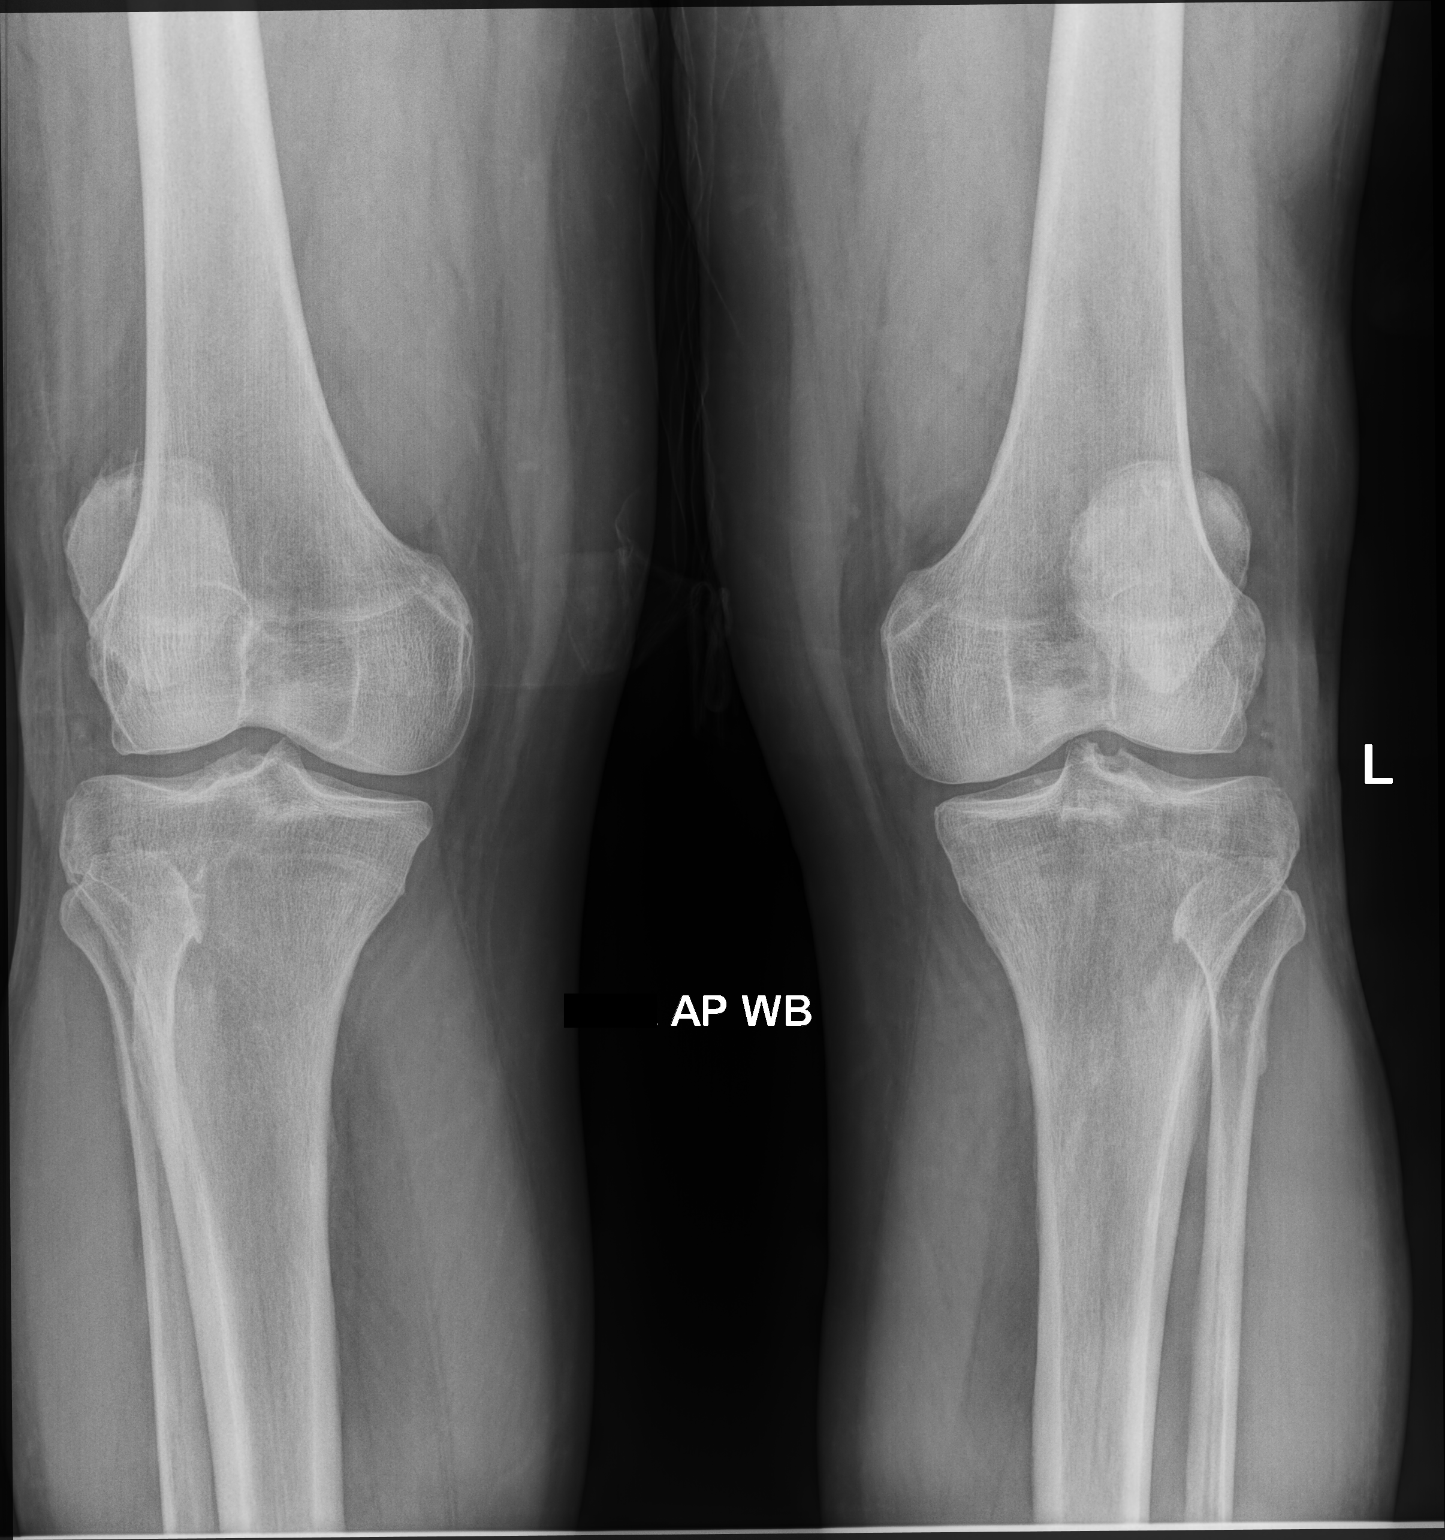

[knee standing lat]
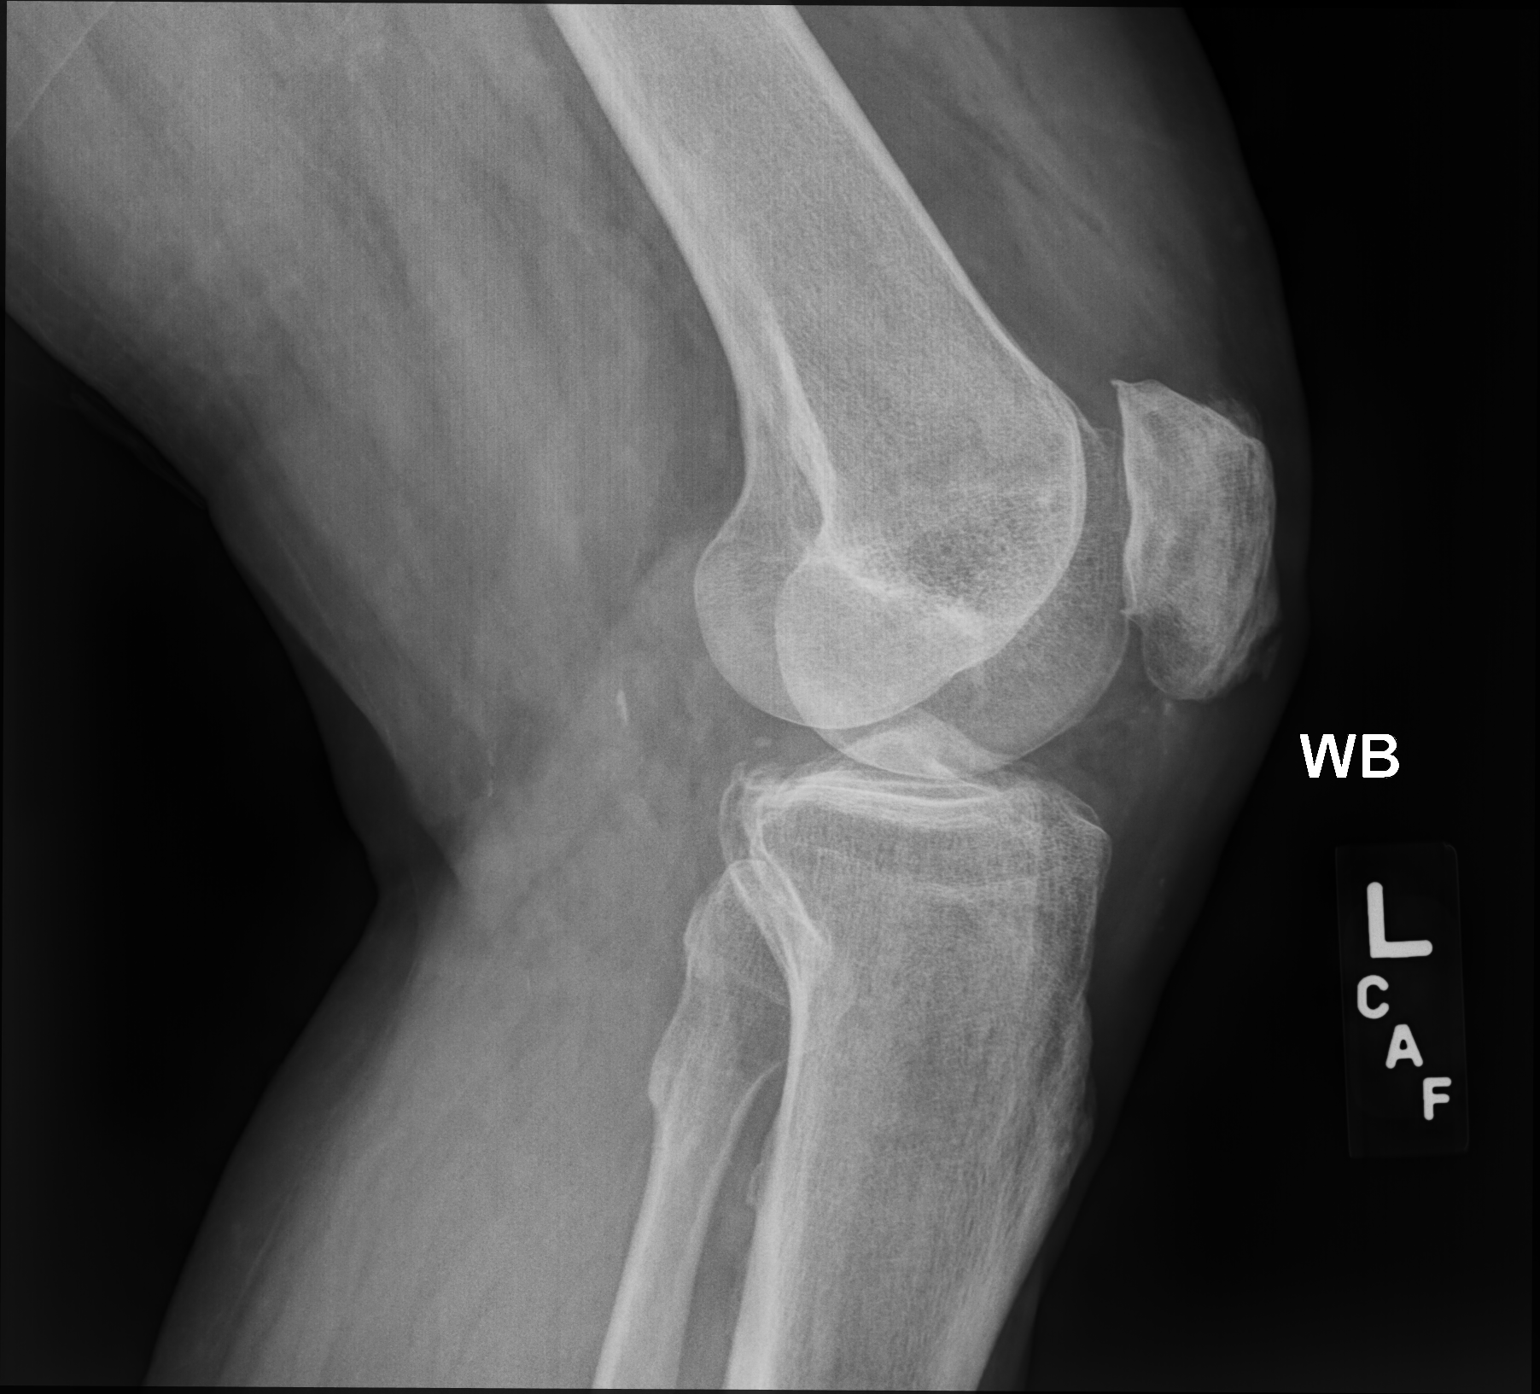

[sunrise]
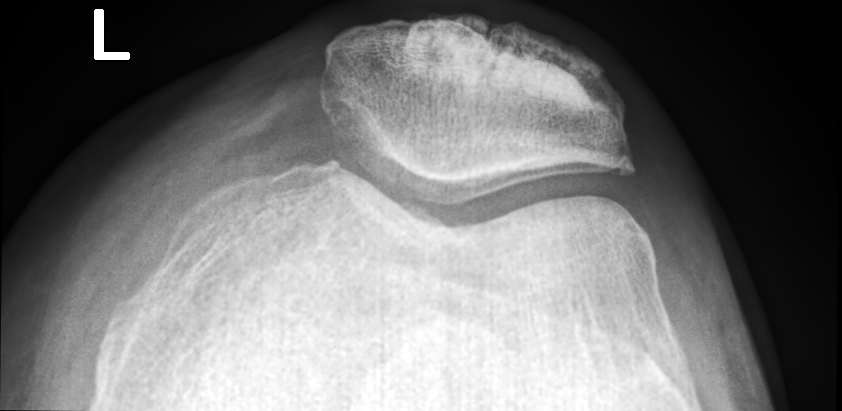

[3 of 3 positions shown; findings below may reference images not displayed]

FINDINGS: Mild to slightly moderate left patellofemoral joint degenerative
changes. Minimal left medial tibiofemoral joint space narrowing.
There may be a small left suprapatellar joint effusion. No fracture
or dislocation.

Single view right knee without significant abnormality.
IMPRESSION: 1. Mild to slightly moderate left patellofemoral joint degenerative
changes.
2. Minimal left medial tibiofemoral joint space narrowing.
3. Possible small left suprapatellar joint effusion.

## 2021-03-22 ENCOUNTER — Telehealth: Payer: Self-pay

## 2021-03-22 NOTE — Telephone Encounter (Signed)
LVM advising patient to call back and schedule TOC. 

## 2021-05-28 IMAGING — CT CT ANGIOGRAPHY CHEST
2 of 7 series · 18 of 46 positions shown · IV contrast (APPLIED)
Comparison: None.

CLINICAL DATA: Chest pain, dizziness and balance issues. Positive
D-dimer.

EXAM:
CT ANGIOGRAPHY CHEST WITH CONTRAST
TECHNIQUE: Multidetector CT imaging of the chest was performed using the
standard protocol during bolus administration of intravenous
contrast. Multiplanar CT image reconstructions and MIPs were
obtained to evaluate the vascular anatomy.
CONTRAST:  100mL OMNIPAQUE IOHEXOL 350 MG/ML SOLN

[Series 7: thins · axial · 0.66mm/px · z∈[+1284,+1514]mm · 15 of 370 slices shown]
[im 21/370  lung]
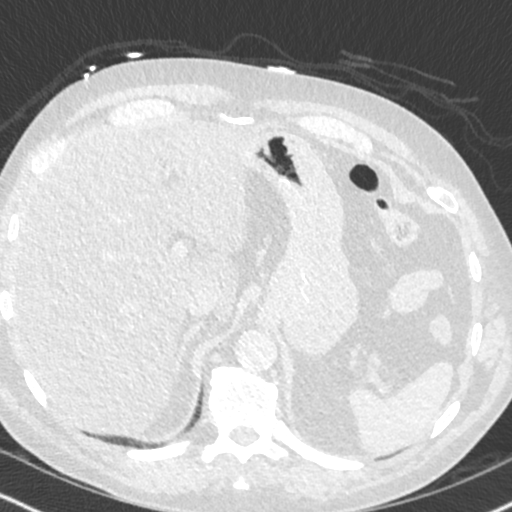
[im 42/370  soft-tissue]
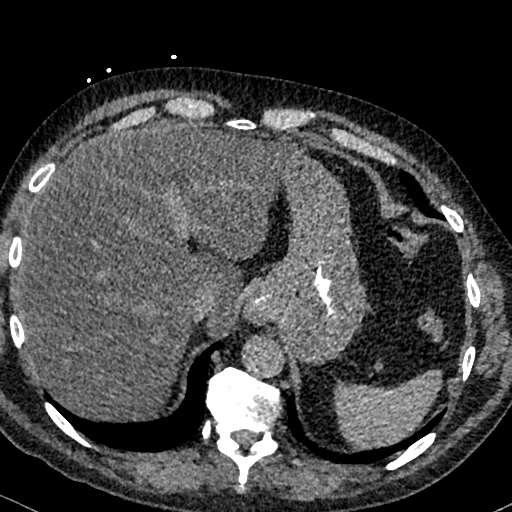
[im 62/370  lung]
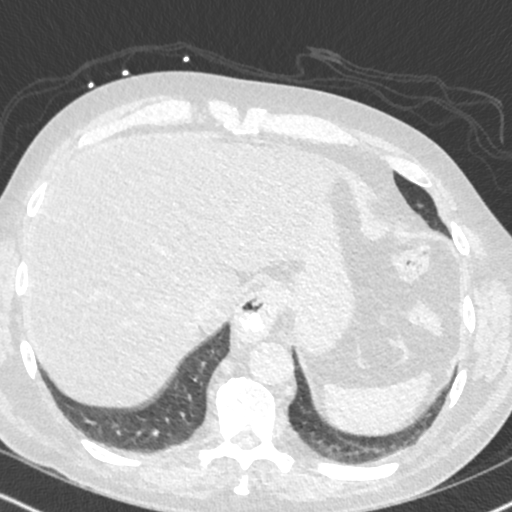
[im 83/370  soft-tissue]
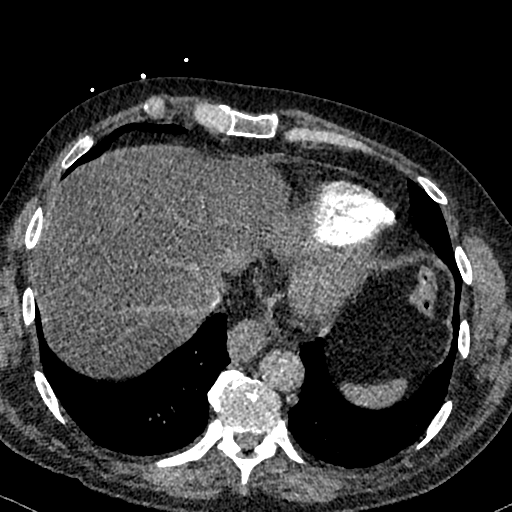
[im 124/370  lung]
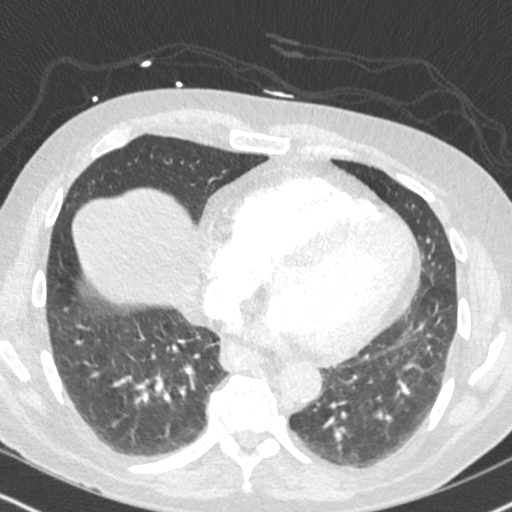
[im 144/370  soft-tissue]
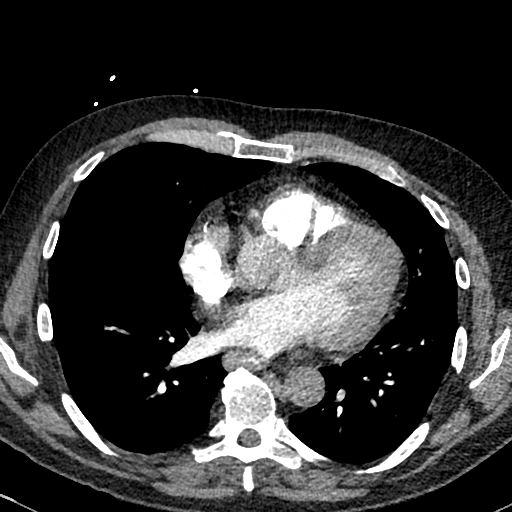
[im 165/370  lung]
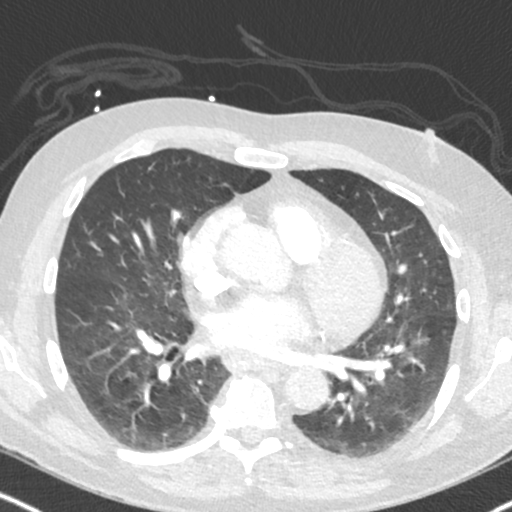
[im 185/370  soft-tissue]
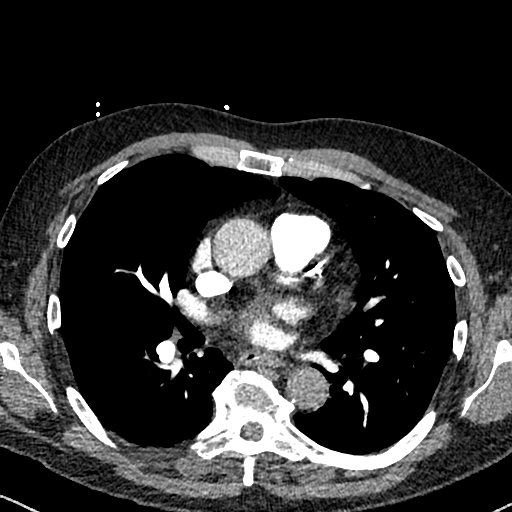
[im 206/370  lung]
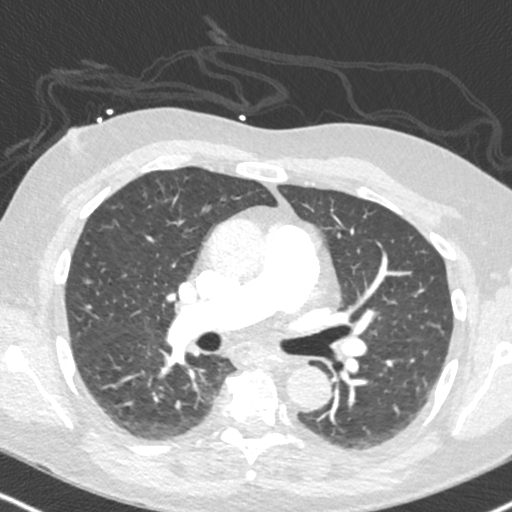
[im 226/370  soft-tissue]
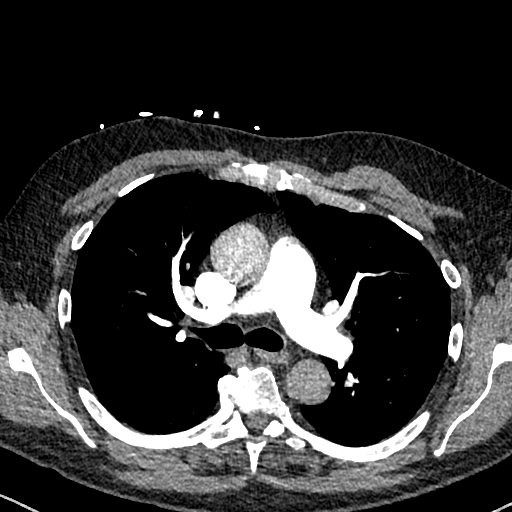
[im 247/370  lung]
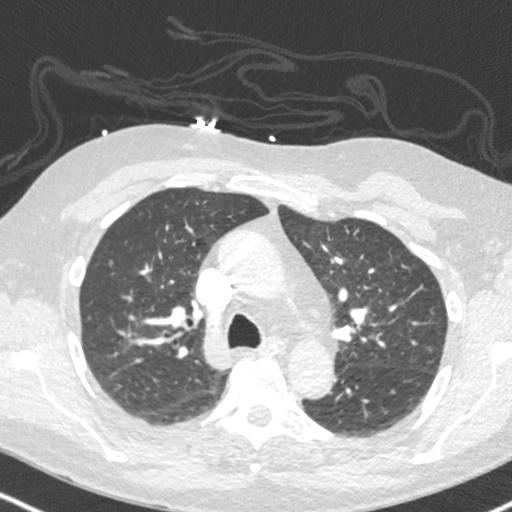
[im 288/370  soft-tissue]
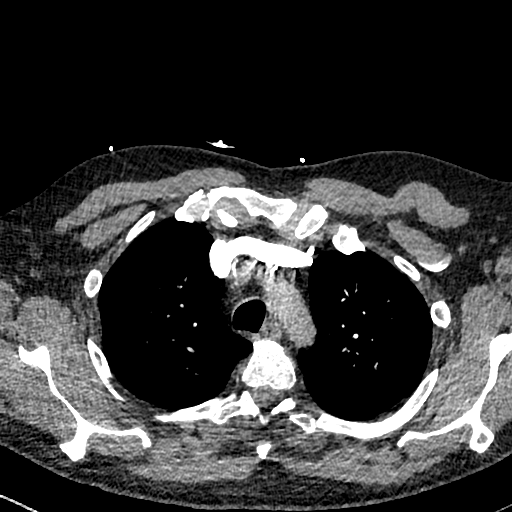
[im 308/370  lung]
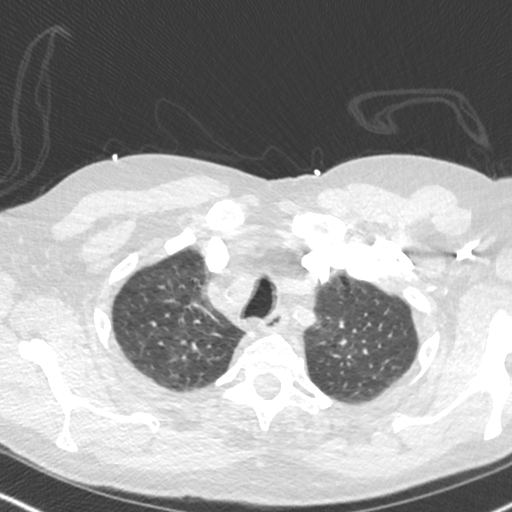
[im 329/370  soft-tissue]
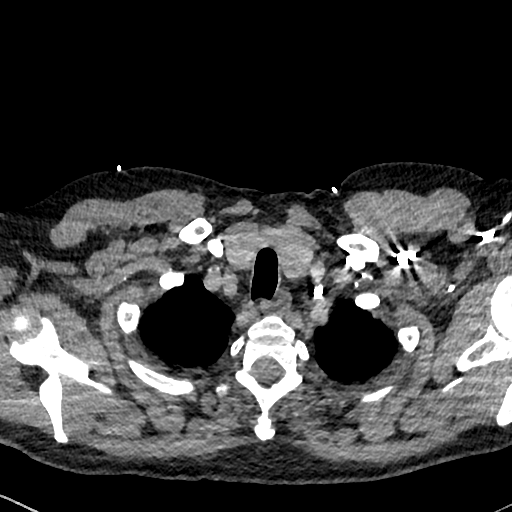
[im 349/370  lung]
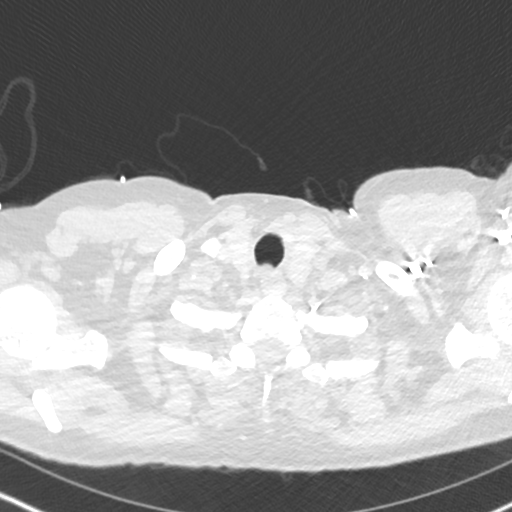

[Series 8: cor · coronal · 0.52mm/px · 3 of 145 slices shown]
[im 37/145  soft-tissue]
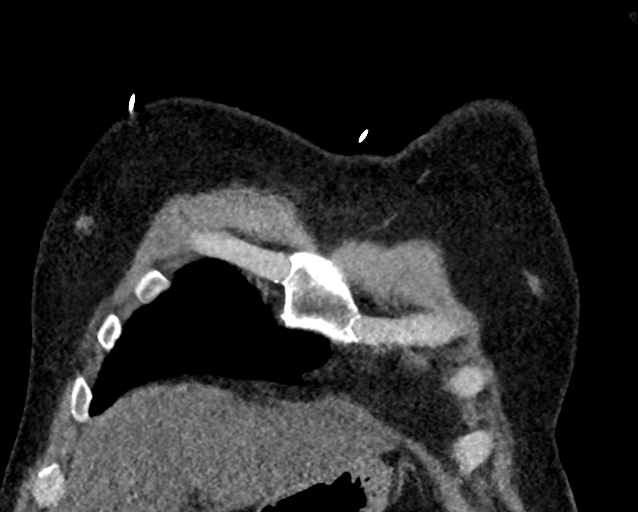
[im 73/145  soft-tissue]
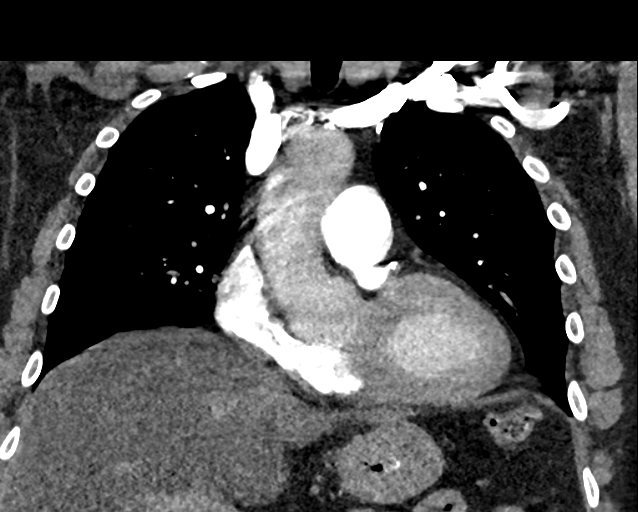
[im 109/145  soft-tissue]
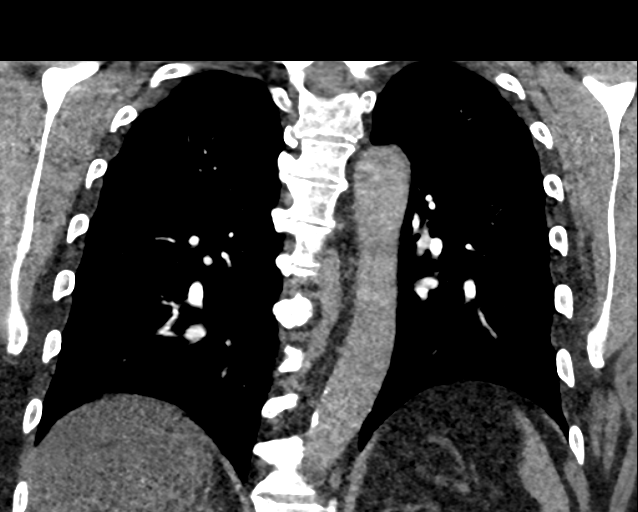

[18 of 46 positions shown; findings below may reference images not displayed]

FINDINGS: Cardiovascular: The heart is normal in size. No pericardial
effusion. The aorta is normal in caliber. Minimal scattered aortic
calcifications.

There are moderate age advanced coronary artery calcifications
noted.

The pulmonary arterial tree is well opacified. No filling defects to
suggest pulmonary embolism.

Mediastinum/Nodes: No mediastinal or hilar mass or lymphadenopathy.
The esophagus is grossly normal. There is a small hiatal hernia.

Lungs/Pleura: No acute pulmonary findings. No infiltrates, edema or
effusions. No interstitial lung disease or bronchiectasis. No
worrisome pulmonary lesions. No pleural effusion.

Upper Abdomen: Diffuse and severe fatty infiltration of the liver.

Musculoskeletal: No significant bony findings. Moderate degenerative
changes involving the spine.

Review of the MIP images confirms the above findings.
IMPRESSION: 1. No CT findings for pulmonary embolism.
2. Age advanced coronary artery calcifications.
3. No thoracic aortic aneurysm dissection.
4. No acute or significant pulmonary findings.
5. Diffuse and fairly marked fatty infiltration of the liver.
6. Small hiatal hernia.

Aortic Atherosclerosis (8926K-RE9.9).

## 2022-05-28 ENCOUNTER — Ambulatory Visit (INDEPENDENT_AMBULATORY_CARE_PROVIDER_SITE_OTHER): Payer: BC Managed Care – PPO | Admitting: Family Medicine

## 2022-05-28 ENCOUNTER — Encounter: Payer: Self-pay | Admitting: Family Medicine

## 2022-05-28 VITALS — BP 150/100 | HR 86 | Temp 97.9°F | Ht 72.0 in | Wt 260.2 lb

## 2022-05-28 DIAGNOSIS — Z136 Encounter for screening for cardiovascular disorders: Secondary | ICD-10-CM

## 2022-05-28 DIAGNOSIS — Z114 Encounter for screening for human immunodeficiency virus [HIV]: Secondary | ICD-10-CM | POA: Diagnosis not present

## 2022-05-28 DIAGNOSIS — Z23 Encounter for immunization: Secondary | ICD-10-CM

## 2022-05-28 DIAGNOSIS — I10 Essential (primary) hypertension: Secondary | ICD-10-CM

## 2022-05-28 DIAGNOSIS — Z1159 Encounter for screening for other viral diseases: Secondary | ICD-10-CM

## 2022-05-28 DIAGNOSIS — Z0000 Encounter for general adult medical examination without abnormal findings: Secondary | ICD-10-CM | POA: Diagnosis not present

## 2022-05-28 DIAGNOSIS — Z125 Encounter for screening for malignant neoplasm of prostate: Secondary | ICD-10-CM

## 2022-05-28 DIAGNOSIS — K219 Gastro-esophageal reflux disease without esophagitis: Secondary | ICD-10-CM

## 2022-05-28 DIAGNOSIS — R7303 Prediabetes: Secondary | ICD-10-CM | POA: Insufficient documentation

## 2022-05-28 DIAGNOSIS — Z1211 Encounter for screening for malignant neoplasm of colon: Secondary | ICD-10-CM

## 2022-05-28 LAB — COMPREHENSIVE METABOLIC PANEL
ALT: 19 U/L (ref 0–53)
AST: 17 U/L (ref 0–37)
Albumin: 4.5 g/dL (ref 3.5–5.2)
Alkaline Phosphatase: 77 U/L (ref 39–117)
BUN: 13 mg/dL (ref 6–23)
CO2: 25 mEq/L (ref 19–32)
Calcium: 9.8 mg/dL (ref 8.4–10.5)
Chloride: 101 mEq/L (ref 96–112)
Creatinine, Ser: 0.9 mg/dL (ref 0.40–1.50)
GFR: 92.42 mL/min (ref >60.00)
Glucose, Bld: 133 mg/dL — ABNORMAL HIGH (ref 70–99)
Potassium: 4.1 mEq/L (ref 3.5–5.1)
Sodium: 136 mEq/L (ref 135–145)
Total Bilirubin: 0.4 mg/dL (ref 0.2–1.2)
Total Protein: 7.5 g/dL (ref 6.0–8.3)

## 2022-05-28 LAB — CBC WITH DIFFERENTIAL/PLATELET
Basophils Absolute: 0.1 10*3/uL (ref 0.0–0.1)
Basophils Relative: 0.9 % (ref 0.0–3.0)
Eosinophils Absolute: 0.2 10*3/uL (ref 0.0–0.7)
Eosinophils Relative: 2.3 % (ref 0.0–5.0)
HCT: 46.3 % (ref 39.0–52.0)
Hemoglobin: 15.4 g/dL (ref 13.0–17.0)
Lymphocytes Relative: 16.6 % (ref 12.0–46.0)
Lymphs Abs: 1.4 10*3/uL (ref 0.7–4.0)
MCHC: 33.3 g/dL (ref 30.0–36.0)
MCV: 92.3 fl (ref 78.0–100.0)
Monocytes Absolute: 0.8 10*3/uL (ref 0.1–1.0)
Monocytes Relative: 9 % (ref 3.0–12.0)
Neutro Abs: 6 10*3/uL (ref 1.4–7.7)
Neutrophils Relative %: 71.2 % (ref 43.0–77.0)
Platelets: 221 10*3/uL (ref 150.0–400.0)
RBC: 5.02 Mil/uL (ref 4.22–5.81)
RDW: 13.6 % (ref 11.5–15.5)
WBC: 8.4 10*3/uL (ref 4.0–10.5)

## 2022-05-28 LAB — LIPID PANEL
Cholesterol: 220 mg/dL — ABNORMAL HIGH (ref 0–200)
HDL: 42.8 mg/dL (ref >39.00)
LDL Cholesterol: 142 mg/dL — ABNORMAL HIGH (ref 0–99)
NonHDL: 176.81
Total CHOL/HDL Ratio: 5
Triglycerides: 175 mg/dL — ABNORMAL HIGH (ref 0.0–149.0)
VLDL: 35 mg/dL (ref 0.0–40.0)

## 2022-05-28 LAB — URINALYSIS, ROUTINE W REFLEX MICROSCOPIC
Bilirubin Urine: NEGATIVE
Hgb urine dipstick: NEGATIVE
Ketones, ur: NEGATIVE
Leukocytes,Ua: NEGATIVE
Nitrite: NEGATIVE
Specific Gravity, Urine: 1.01 (ref 1.000–1.030)
Total Protein, Urine: NEGATIVE
Urine Glucose: NEGATIVE
Urobilinogen, UA: 0.2 (ref 0.0–1.0)
pH: 6.5 (ref 5.0–8.0)

## 2022-05-28 LAB — HEMOGLOBIN A1C: Hgb A1c MFr Bld: 7 % — ABNORMAL HIGH (ref 4.6–6.5)

## 2022-05-28 LAB — MICROALBUMIN / CREATININE URINE RATIO
Creatinine,U: 48.8 mg/dL
Microalb Creat Ratio: 4.6 mg/g (ref 0.0–30.0)
Microalb, Ur: 2.2 mg/dL — ABNORMAL HIGH (ref 0.0–1.9)

## 2022-05-28 LAB — PSA: PSA: 0.51 ng/mL (ref 0.10–4.00)

## 2022-05-28 LAB — TSH: TSH: 0.94 u[IU]/mL (ref 0.35–5.50)

## 2022-05-28 MED ORDER — AMLODIPINE BESYLATE 10 MG PO TABS: ORAL_TABLET | ORAL | 0 refills | Status: DC

## 2022-05-28 MED ORDER — OMEPRAZOLE MAGNESIUM 20 MG PO TBEC: 20.0000 mg | DELAYED_RELEASE_TABLET | Freq: Every day | ORAL | 3 refills | Status: DC

## 2022-05-28 MED ORDER — CARVEDILOL 25 MG PO TABS: 25.0000 mg | ORAL_TABLET | Freq: Two times a day (BID) | ORAL | 3 refills | Status: DC

## 2022-05-28 MED ORDER — ESCITALOPRAM OXALATE 20 MG PO TABS: 20.0000 mg | ORAL_TABLET | Freq: Every day | ORAL | 3 refills | Status: DC

## 2022-05-28 NOTE — Patient Instructions (Addendum)
Welcome to [facility]! It was a pleasure meeting you today.  As discussed, Please schedule a 6 month follow up visit today. [phone] for ultrasound   PLEASE NOTE:  If you had any LAB tests please let us know if you have not heard back within a few days. You may see your results on MyChart before we have a chance to review them but we will give you a call once they are reviewed by [place]. If we ordered any REFERRALS today, please let us know if you have not heard from their office within the next week.  Let us know through MyChart if you are needing REFILLS, or have your pharmacy send [place] the request. You can also use MyChart to communicate with me or any office staff.  Please try these tips to maintain a healthy lifestyle:  Eat most of your calories during the day when you are active. Eliminate processed foods including packaged sweets (pies, cakes, cookies), reduce intake of potatoes, white bread, white pasta, and white rice. Look for whole grain options, oat flour or almond flour.  Each meal should contain half fruits/vegetables, one quarter protein, and one quarter carbs (no bigger than a computer mouse).  Cut down on sweet beverages. This includes juice, soda, and sweet tea. Also watch fruit intake, though this is a healthier sweet option, it still contains natural sugar! Limit to 3 servings daily.  Drink at least 1 glass of water with each meal and aim for at least 8 glasses per day  Exercise at least 150 minutes every week.

## 2022-05-28 NOTE — Progress Notes (Signed)
Phone: [phone]   Subjective:  Dustin Black 61 y.o. male presenting for annual physical.  Chief Complaint  Dustin Black presents with   Establish Care    Need annual physical  Fasting for labs   New pt-last seen LPC-05/2019-travels out of country so has been getting meds from [place]  CPX HTN-Pt is on  coreg 25 daily and amlodipine '10mg'$ .  Bp's running 120-130/70's .  No ha/dizziness/cp/palp/cough/sob. Some edema occ.  More active.  Always high at doctor's.  Depression/anxiety-doing well on lexapro '20mg'$ .  No SI GERD-doing well on omeprazole.  If misses a day, feels heartburn. No dyspagia Cscope 7-8 yrs ago. 2017.some polyps-[facility].  Dad dec 64 w/AAA and sis has one as well.  See problem oriented charting- ROS- ROS: Gen: no fever, chills  Skin: no rash, itching ENT: no ear pain, ear drainage, nasal congestion, rhinorrhea, sinus pressure, sore throat Eyes: no blurry vision, double vision Resp: no cough, wheeze,SOB CV: no CP, palpitations, LE edema,  GI: no heartburn, n/v/d/c, abd pain GU: no dysuria, urgency, frequency, hematuria. Some ED-taking viagra-working well.   MSK: no joint pain, myalgias, back pain Neuro: no dizziness, headache, weakness, vertigo Psych: HPI  The following were reviewed and entered/updated in epic: Past Medical History:  Diagnosis Date   Anxiety    Depression    GERD (gastroesophageal reflux disease)    History of chickenpox    Hyperlipidemia    Hypertension    Thyroid disease 01/23/2014   I hade a module and it was to small to test. My sister had thyroid cancer.   Dustin Black Active Problem List   Diagnosis Date Noted   Prediabetes 05/28/2022   Visit for preventive health examination 07/16/2018   Prostate cancer screening 07/16/2018   History of colon polyps 07/02/2018   Medial knee pain, left 07/02/2018   Essential hypertension 07/02/2018   Gastroesophageal reflux disease 02/11/2017   Past Surgical History:  Procedure Laterality Date    ESOPHAGEAL DILATION      Family History  Problem Relation Age of Onset   Diabetes Mother    Hypertension Mother    Heart attack Mother    Depression Mother    Aneurysm Father        Died at 50   Cancer Father        Bladder   Early death Father    Cancer Sister        Thyroid cancer   Depression Sister     Medications- reviewed and updated Current Outpatient Medications  Medication Sig Dispense Refill   aspirin 81 MG tablet Take 81 mg by mouth daily.     sildenafil (VIAGRA) 100 MG tablet Take 100 mg by mouth daily as needed.     amLODipine (NORVASC) 10 MG tablet TAKE 1 TABLET BY MOUTH DAILY. 30 tablet 0   carvedilol (COREG) 25 MG tablet Take 1 tablet (25 mg total) by mouth 2 (two) times daily with a meal. TAKE 1 TABLET BY MOUTH 2 (TWO) TIMES DAILY WITH A MEAL. 180 tablet 3   escitalopram (LEXAPRO) 20 MG tablet Take 1 tablet (20 mg total) by mouth daily. 90 tablet 3   omeprazole (PRILOSEC OTC) 20 MG tablet Take 1 tablet (20 mg total) by mouth daily. 90 tablet 3   No current facility-administered medications for this visit.    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  HENT:     Head: Normocephalic and atraumatic.     Right Ear: Tympanic membrane, ear canal and external ear normal.     Left Ear: Tympanic membrane, ear canal and external ear normal.     Nose: Nose normal.     Mouth/Throat:     Mouth: Mucous membranes are moist.     Pharynx: Oropharynx is clear.  Eyes:     General: No scleral icterus.    Extraocular Movements: Extraocular movements intact.     Conjunctiva/sclera: Conjunctivae normal.     Pupils: Pupils are equal, round,  and reactive to light.  Neck:     Vascular: No carotid bruit.  Cardiovascular:     Rate and Rhythm: Normal rate and regular rhythm.     Pulses: Normal pulses.     Heart sounds: No murmur heard. Pulmonary:     Effort: Pulmonary effort is normal.     Breath sounds: Normal breath sounds.  Abdominal:     General: Abdomen is flat. Bowel sounds are normal. There is no distension.     Palpations: Abdomen is soft. There is no mass.     Tenderness: There is no abdominal tenderness.  Musculoskeletal:     Cervical back: Neck supple.     Right lower leg: No edema.     Left lower leg: No edema.  Lymphadenopathy:     Cervical: No cervical adenopathy.  Neurological:     General: No focal deficit present.     Mental Status: He is alert and oriented to person, place, and time.     Cranial Nerves: No cranial nerve deficit.  Psychiatric:        Mood and Affect: Mood normal.     Reviewed old labs-a1c 5.9    Assessment and Plan   Health Maintenance counseling: 1. Anticipatory guidance: Dustin Black counseled regarding regular dental exams q6 months, eye exams yearly, avoiding smoking and second hand smoke, limiting alcohol to 2 beverages per day.   2. Risk factor reduction:  Advised Dustin Black of need for regular exercise and diet rich in fruits and vegetables to reduce risk of heart attack and stroke. Exercise- regularly.   Wt Readings from Last 3 Encounters:  05/28/22 260 lb 4 oz (118 kg)  05/02/19 226 lb (102.5 kg)  08/25/18 239 lb 3.2 oz (108.5 kg)   3. Immunizations/screenings/ancillary studies Immunization History  Administered Date(s) Administered   Influenza,inj,quad, With Preservative 08/29/2019, 08/29/2019   Moderna Sars-Covid-2 Vaccination 09/29/2020   PFIZER(Purple Top)SARS-COV-2 Vaccination 02/02/2020, 02/26/2020, 03/21/2021   Pfizer Covid-19 Vaccine Bivalent Booster 18yr & up 07/26/2021   Tdap 05/28/2022   Zoster Recombinat (Shingrix) 08/26/2020, 12/06/2020   Health Maintenance  Due  Topic Date Due   Hepatitis C Screening  Never done   COLONOSCOPY (Pts 45-449yrInsurance coverage will need to be confirmed)  12/12/2020    4. Prostate cancer screening >55yo - risk factors?  Lab Results  Component Value Date   PSA 0.84 07/16/2018    5. Colon cancer screening:sch 6. Skin cancer screening- Iadvised regular sunscreen use. Denies worrisome, changing, or new skin lesions.  7. Smoking associated screening (lung cancer screening, AAA screen 65-75, UA)- non smoker  Problem List  Items Addressed This Visit       Cardiovascular and Mediastinum   Essential hypertension   Relevant Medications   sildenafil (VIAGRA) 100 MG tablet   amLODipine (NORVASC) 10 MG tablet   carvedilol (COREG) 25 MG tablet   Other Relevant Orders   US AORTA     Other   Prostate cancer screening   Relevant Orders   PSA   Prediabetes   Relevant Orders   Hemoglobin A1c   Other Visit Diagnoses     Wellness examination    -  Primary   Relevant Orders   Hemoglobin A1c   Lipid panel   TSH   Comprehensive metabolic panel   CBC with Differential/Platelet   PSA   Urinalysis, Routine w reflex microscopic   Microalbumin / creatinine urine ratio   Hepatitis C antibody   HIV Antibody (routine testing w rflx)   US AORTA   Encounter for hepatitis C screening test for low risk Dustin Black       Relevant Orders   Hepatitis C antibody   Screening for HIV without presence of risk factors       Relevant Orders   HIV Antibody (routine testing w rflx)   Screen for colon cancer       Relevant Orders   Ambulatory referral to Gastroenterology   Screening for AAA (abdominal aortic aneurysm)       Relevant Orders   US AORTA   Need for Tdap vaccination       Relevant Orders   Tdap vaccine greater than or equal to 7yo IM (Completed)      Wellness-antic guidance-Tdap given.  Colon ordered.  Check cbc,cmp,tsh,lipids,a1c,ua HTN-chronic.  Controlled at home.  Check w/another cuff.  Inc coreg to '25mg'$  bid,  cont amlodipine '10mg'$ .  Monitor.  Check microalb/creat ratio preDM-work on TLC, check A1C Depression-chronic.  Controlled on lexapro '20mg'$ . Renewed GERD-chronic.  Controlled on prilosec '20mg'$  daily.  Cont.   FH AAA and pt w/HTN-check AAA  F/u 2mhtn,predm,depression  Recommended follow up: [id number] in about 6 months (around 11/28/2022) for htn. No future appointments.    Lab/Order associations:+ fasting   ICD-10-CM   1. Wellness examination  Z00.00 Hemoglobin A1c    Lipid panel    TSH    Comprehensive metabolic panel    CBC with Differential/Platelet    PSA    Urinalysis, Routine w reflex microscopic    Microalbumin / creatinine urine ratio    Hepatitis C antibody    HIV Antibody (routine testing w rflx)    [place]    2. Essential hypertension  I10 [place]    amLODipine (NORVASC) 10 MG tablet    carvedilol (COREG) 25 MG tablet    3. Prostate cancer screening  Z12.5 PSA    4. Prediabetes  R73.03 Hemoglobin A1c    5. Encounter for hepatitis C screening test for low risk Dustin Black  Z11.59 Hepatitis C antibody    6. Screening for HIV without presence of risk factors  Z11.4 HIV Antibody (routine testing w rflx)    7. Screen for colon cancer  Z12.11 Ambulatory referral to Gastroenterology    8. Screening for AAA (abdominal aortic aneurysm)  Z13.6 [place]    9. Need for Tdap vaccination  Z23 Tdap vaccine greater than or equal to 7yo IM      Meds ordered this encounter  Medications   amLODipine (NORVASC) 10 MG tablet    Sig: TAKE 1 TABLET BY MOUTH DAILY.  Dispense:  30 tablet    Refill:  0   carvedilol (COREG) 25 MG tablet    Sig: Take 1 tablet (25 mg total) by mouth 2 (two) times daily with a meal. TAKE 1 TABLET BY MOUTH 2 (TWO) TIMES DAILY WITH A MEAL.    Dispense:  180 tablet    Refill:  3   escitalopram (LEXAPRO) 20 MG tablet    Sig: Take 1 tablet (20 mg total) by mouth daily.    Dispense:  90 tablet    Refill:  3   omeprazole (PRILOSEC OTC) 20 MG tablet     Sig: Take 1 tablet (20 mg total) by mouth daily.    Dispense:  90 tablet    Refill:  3     Wellington Hampshire, MD

## 2022-05-29 LAB — HIV ANTIBODY (ROUTINE TESTING W REFLEX): HIV 1&2 Ab, 4th Generation: NONREACTIVE

## 2022-05-29 LAB — HEPATITIS C ANTIBODY: Hepatitis C Ab: NONREACTIVE

## 2022-06-03 ENCOUNTER — Ambulatory Visit
Admission: RE | Admit: 2022-06-03 | Discharge: 2022-06-03 | Disposition: A | Discharge status: Home / Self Care | Payer: BC Managed Care – PPO | Source: Ambulatory Visit | Attending: Family Medicine | Admitting: Family Medicine

## 2022-06-03 DIAGNOSIS — I714 Abdominal aortic aneurysm, without rupture, unspecified: Secondary | ICD-10-CM | POA: Diagnosis not present

## 2022-06-03 DIAGNOSIS — Z0000 Encounter for general adult medical examination without abnormal findings: Secondary | ICD-10-CM

## 2022-06-03 DIAGNOSIS — I10 Essential (primary) hypertension: Secondary | ICD-10-CM

## 2022-06-03 DIAGNOSIS — Z136 Encounter for screening for cardiovascular disorders: Secondary | ICD-10-CM

## 2022-06-10 ENCOUNTER — Encounter: Payer: Self-pay | Admitting: Family Medicine

## 2022-06-10 ENCOUNTER — Ambulatory Visit (INDEPENDENT_AMBULATORY_CARE_PROVIDER_SITE_OTHER): Payer: BC Managed Care – PPO | Admitting: Family Medicine

## 2022-06-10 VITALS — BP 120/68 | HR 79 | Temp 98.0°F | Ht 72.0 in | Wt 258.4 lb

## 2022-06-10 DIAGNOSIS — Z6835 Body mass index (BMI) 35.0-35.9, adult: Secondary | ICD-10-CM

## 2022-06-10 DIAGNOSIS — E119 Type 2 diabetes mellitus without complications: Secondary | ICD-10-CM

## 2022-06-10 MED ORDER — OZEMPIC (0.25 OR 0.5 MG/DOSE) 2 MG/3ML ~~LOC~~ SOPN: PEN_INJECTOR | SUBCUTANEOUS | 1 refills | Status: AC

## 2022-06-10 NOTE — Progress Notes (Signed)
Subjective:     Patient ID: Dustin Black, male    DOB: [birth year], 61 y.o.   MRN: [id number]  Chief Complaint  Patient presents with   Diabetes    Discuss diabetes, go over previous blood work fasting      HPI New DM-Annual CPX 05/28/22-gluc 133, trigs 175, LDL 142, HDL 42, A1C 7.0.  pt has been reading about diet.  Was eating a lot of corn on the cob.  Working more on exercise/diet. Has decreased sugars and carbs. A friend is on Dustin Black Sis had thyroid ca. No fh MEN.  Has 2 home cuffs-bp's 120's/60-70  Health Maintenance Due  Topic Date Due   FOOT EXAM  Never done   OPHTHALMOLOGY EXAM  Never done   COLONOSCOPY (Pts 45-71yr Insurance coverage will need to be confirmed)  12/12/2020    Past Medical History:  Diagnosis Date   Anxiety    Depression    GERD (gastroesophageal reflux disease)    History of chickenpox    Hyperlipidemia    Hypertension    Thyroid disease 01/23/2014   I hade a module and it was to small to test. My sister had thyroid cancer.    Past Surgical History:  Procedure Laterality Date   ESOPHAGEAL DILATION      Outpatient Medications Prior to Visit  Medication Sig Dispense Refill   amLODipine (NORVASC) 10 MG tablet TAKE 1 TABLET BY MOUTH DAILY. 30 tablet 0   aspirin 81 MG tablet Take 81 mg by mouth daily.     carvedilol (COREG) 25 MG tablet Take 1 tablet (25 mg total) by mouth 2 (two) times daily with a meal. TAKE 1 TABLET BY MOUTH 2 (TWO) TIMES DAILY WITH A MEAL. 180 tablet 3   escitalopram (LEXAPRO) 20 MG tablet Take 1 tablet (20 mg total) by mouth daily. 90 tablet 3   omeprazole (PRILOSEC) 20 MG capsule Take 20 mg by mouth daily.     sildenafil (VIAGRA) 100 MG tablet Take 100 mg by mouth daily as needed.     omeprazole (PRILOSEC OTC) 20 MG tablet Take 1 tablet (20 mg total) by mouth daily. 90 tablet 3   No facility-administered medications prior to visit.    No Known Allergies ROS neg/noncontributory except as noted HPI/below       Objective:     BP 120/68   Pulse 79   Temp 98 F (36.7 C) (Temporal)   Ht 6' (1.829 m)   Wt 258 lb 6 oz (117.2 kg)   SpO2 96%   BMI 35.04 kg/m  Wt Readings from Last 3 Encounters:  06/10/22 258 lb 6 oz (117.2 kg)  05/28/22 260 lb 4 oz (118 kg)  05/02/19 226 lb (102.5 kg)    Physical Exam   Gen: WDWN NAD HEENT: NCAT, conjunctiva not injected, sclera nonicteric EXT:  no edema MSK: no gross abnormalities.  NEURO: A&O x3.  CN II-XII intact.  PSYCH: normal mood. Good eye contact     Assessment & Plan:   Problem List Items Addressed This Visit       Endocrine   Type 2 diabetes mellitus without complication, without long-term current use of insulin ([place] - Primary   Relevant Medications   Semaglutide,0.25 or 0.'5MG'$ /DOS, (Dustin Black, 0.25 OR 0.5 MG/DOSE,) 2 MG/3ML SOPN   Other Visit Diagnoses     Class 2 severe obesity due to excess calories with serious comorbidity and body mass index (BMI) of 35.0 to 35.9 in adult (Dustin Black  Relevant Medications   Semaglutide,0.25 or 0.'5MG'$ /DOS, (Dustin Black, 0.25 OR 0.5 MG/DOSE,) 2 MG/3ML SOPN      New DM type 2-discussed options.  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  Will work on diet/exercise.  Wants to do Dustin Black-start 0/25,g weekly x4 then 0.'5mg'$  x4 wks.  Then message as will inc to '1mg'$ .   F/u 3 mo Obesity-HTN and now DM.  Work on diet/exercise.  Will start Dustin Black to wt and DM.    Meds ordered this encounter  Medications   Semaglutide,0.25 or 0.'5MG'$ /DOS, (Dustin Black, 0.25 OR 0.5 MG/DOSE,) 2 MG/3ML SOPN    Sig: Inject 0.25 mg into the skin once a week for 28 days, THEN 0.5 mg once a week for 28 days.    Dispense:  3 mL    Refill:  1    Dustin Hampshire, MD

## 2022-06-10 NOTE — Patient Instructions (Signed)
It was very nice to see you today!  Work on diet/exercise   PLEASE NOTE:  If you had any lab tests please let us know if you have not heard back within a few days. You may see your results on MyChart before we have a chance to review them but we will give you a call once they are reviewed by us. If we ordered any referrals today, please let us know if you have not heard from their office within the next week.   Please try these tips to maintain a healthy lifestyle:  Eat most of your calories during the day when you are active. Eliminate processed foods including packaged sweets (pies, cakes, cookies), reduce intake of potatoes, white bread, white pasta, and white rice. Look for whole grain options, oat flour or almond flour.  Each meal should contain half fruits/vegetables, one quarter protein, and one quarter carbs (no bigger than a computer mouse).  Cut down on sweet beverages. This includes juice, soda, and sweet tea. Also watch fruit intake, though this is a healthier sweet option, it still contains natural sugar! Limit to 3 servings daily.  Drink at least 1 glass of water with each meal and aim for at least 8 glasses per day  Exercise at least 150 minutes every week.   

## 2022-06-19 ENCOUNTER — Other Ambulatory Visit: Payer: Self-pay | Admitting: Family Medicine

## 2022-06-19 DIAGNOSIS — I10 Essential (primary) hypertension: Secondary | ICD-10-CM

## 2022-07-18 ENCOUNTER — Encounter: Payer: Self-pay | Admitting: Nurse Practitioner

## 2022-08-05 ENCOUNTER — Encounter: Payer: Self-pay | Admitting: *Deleted

## 2022-08-09 ENCOUNTER — Encounter: Payer: Self-pay | Admitting: Family Medicine

## 2022-08-09 ENCOUNTER — Other Ambulatory Visit: Payer: Self-pay | Admitting: Family Medicine

## 2022-08-09 DIAGNOSIS — E119 Type 2 diabetes mellitus without complications: Secondary | ICD-10-CM

## 2022-08-09 MED ORDER — SEMAGLUTIDE (1 MG/DOSE) 4 MG/3ML ~~LOC~~ SOPN: 1.0000 mg | PEN_INJECTOR | SUBCUTANEOUS | 2 refills | Status: DC

## 2022-08-13 ENCOUNTER — Ambulatory Visit (INDEPENDENT_AMBULATORY_CARE_PROVIDER_SITE_OTHER): Payer: BC Managed Care – PPO | Admitting: Nurse Practitioner

## 2022-08-13 ENCOUNTER — Encounter: Payer: Self-pay | Admitting: Nurse Practitioner

## 2022-08-13 VITALS — BP 124/66 | HR 70 | Ht 72.0 in | Wt 253.0 lb

## 2022-08-13 DIAGNOSIS — Z1211 Encounter for screening for malignant neoplasm of colon: Secondary | ICD-10-CM

## 2022-08-13 MED ORDER — NA SULFATE-K SULFATE-MG SULF 17.5-3.13-1.6 GM/177ML PO SOLN: 1.0000 | Freq: Once | ORAL | 0 refills | Status: AC

## 2022-08-13 NOTE — Patient Instructions (Signed)
You have been scheduled for a colonoscopy. Please follow written instructions given to you at your visit today.  Please pick up your prep supplies at the pharmacy within the next 1-3 days. If you use inhalers (even only as needed), please bring them with you on the day of your procedure.  The [facility] providers would like to encourage you to use MYCHART to communicate with providers for non-urgent requests or questions.  Due to long hold times on the telephone, sending your provider a message by MYCHART may be a faster and more efficient way to get a response.  Please allow 48 business hours for a response.  Please remember that this is for non-urgent requests.   Due to recent changes in healthcare laws, you may see the results of your imaging and laboratory studies on MyChart before your provider has had a chance to review them.  We understand that in some cases there may be results that are confusing or concerning to you. Not all laboratory results come back in the same time frame and the provider may be waiting for multiple results in order to interpret others.  Please give us 48 hours in order for your provider to thoroughly review all the results before contacting the office for clarification of your results.    

## 2022-08-13 NOTE — Progress Notes (Signed)
Chief Complaint:  none. 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 Records unable to be obtained ( patient tried to get them). He was advised to have a 5 year follow up colonoscopy. He has no alarm features. His aunt had colon cancer in her 73's and a first cousin around 16-76 years of age  Will schedule for a screening colonoscopy as I cannot confirm his history of polyps without records. The risks and benefits of colonoscopy with possible polypectomy / biopsies were discussed and the patient agrees to proceed.    # Chronic GERD. Pyrosis controlled on + daily PPI. Gives a history of what sounds like an esophageal stricture s/p remote esophageal dilation. No dysphagia since.    HPI:    Dustin Black is a 61 y.o. year old male  a past medical history of HTN, DM, GERD, obesity, ? Colon polyps.   See PMH / [facility] for additional history.   Patient is referred by PCP for colon cancer screening. He gives a history of colon polyps removed about 7 years ago at [facility] that he needed a repeat colonoscopy in 5 years. His aunt had colon cancer in her 51's. His first cousin had colon cancer around 60-38 years of age. Patient has no bowel changes. No blood in stool. No unexplained weight loss  Previous Labs / Imaging::    Latest Ref Rng & Units 05/28/2022    8:50 AM 06/23/2019    9:13 AM 05/02/2019   11:30 AM  CBC  WBC 4.0 - 10.5 K/uL 8.4  9.7  14.1   Hemoglobin 13.0 - 17.0 g/dL 15.4  14.2  16.7   Hematocrit 39.0 - 52.0 % 46.3  42.2  47.6   Platelets 150.0 - 400.0 K/uL 221.0  225.0  179     No results found for: "LIPASE"    Latest Ref Rng & Units 05/28/2022    8:50 AM 06/23/2019    9:13 AM 05/02/2019   11:30 AM  CMP  Glucose 70 - 99 mg/dL 133  144  137   BUN 6 - 23 mg/dL '13  8  14   '$ Creatinine 0.40 - 1.50 mg/dL 0.90  0.77  1.27   Sodium 135 - 145 mEq/L 136  138  131   Potassium 3.5 -  5.1 mEq/L 4.1  3.4  3.2   Chloride 96 - 112 mEq/L 101  100  90   CO2 19 - 32 mEq/L '25  26  26   '$ Calcium 8.4 - 10.5 mg/dL 9.8  9.4  12.9   Total Protein 6.0 - 8.3 g/dL 7.5  7.1  7.7   Total Bilirubin 0.2 - 1.2 mg/dL 0.4  0.6  2.1   Alkaline Phos 39 - 117 U/L 77  77  68   AST 0 - 37 U/L 17  23  106   ALT 0 - 53 U/L 19  31  125      Past Medical History:  Diagnosis Date   Anxiety    Depression    GERD (gastroesophageal reflux disease)    History of chickenpox    Hyperlipidemia    Hypertension    Thyroid disease 01/23/2014   I hade a module and it was to small to test. My sister had thyroid cancer.   Past Surgical History:  Procedure Laterality Date   ESOPHAGEAL DILATION  Family History  Problem Relation Age of Onset   Diabetes Mother    Hypertension Mother    Heart attack Mother    Depression Mother    Aneurysm Father        Died at 62   Cancer Father        Bladder   Early death Father    Cancer Sister        Thyroid cancer   Depression Sister    Social History   Tobacco Use   Smoking status: Former    Packs/day: 1.00    Years: 30.00    Total pack years: 30.00    Types: Cigarettes    Quit date: 12/10/2013    Years since quitting: 8.6   Smokeless tobacco: Never  Vaping Use   Vaping Use: Never used  Substance Use Topics   Alcohol use: Yes    Alcohol/week: 10.0 standard drinks of alcohol    Types: 10 Cans of beer per week    Comment: 2-3/day   Drug use: Never   Current Outpatient Medications  Medication Sig Dispense Refill   amLODipine (NORVASC) 10 MG tablet TAKE 1 TABLET BY MOUTH EVERY DAY 90 tablet 3   aspirin 81 MG tablet Take 81 mg by mouth daily.     carvedilol (COREG) 25 MG tablet Take 1 tablet (25 mg total) by mouth 2 (two) times daily with a meal. TAKE 1 TABLET BY MOUTH 2 (TWO) TIMES DAILY WITH A MEAL. 180 tablet 3   escitalopram (LEXAPRO) 20 MG tablet Take 1 tablet (20 mg total) by mouth daily. 90 tablet 3   omeprazole (PRILOSEC) 20 MG capsule  Take 20 mg by mouth daily.     Semaglutide, 1 MG/DOSE, 4 MG/3ML SOPN Inject 1 mg as directed once a week. 3 mL 2   sildenafil (VIAGRA) 100 MG tablet Take 100 mg by mouth daily as needed.     No current facility-administered medications for this visit.   No Known Allergies   Review of Systems: All systems reviewed and negative except where noted in HPI.   Wt Readings from Last 3 Encounters:  08/13/22 253 lb (114.8 kg)  06/10/22 258 lb 6 oz (117.2 kg)  05/28/22 260 lb 4 oz (118 kg)    Physical Exam   BP 124/66   Pulse 70   Ht 6' (1.829 m)   Wt 253 lb (114.8 kg)   BMI 34.31 kg/m  Constitutional:  Generally well appearing male in no acute distress. Psychiatric: Pleasant. Normal mood and affect. Behavior is normal. EENT: Pupils normal.  Conjunctivae are normal. No scleral icterus. Neck supple.  Cardiovascular: Normal rate, regular rhythm. No edema Pulmonary/chest: Effort normal and breath sounds normal. No wheezing, rales or rhonchi. Abdominal: Soft, nondistended, nontender. Bowel sounds active throughout. There are no masses palpable. No hepatomegaly. Neurological: Alert and oriented to person place and time. Skin: Skin is warm and dry. No rashes noted.  Dustin Savoy, NP  08/13/2022, 3:25 PM  Cc:  Referring Provider Dustin Crook, MD

## 2022-08-14 ENCOUNTER — Encounter: Payer: Self-pay | Admitting: Gastroenterology

## 2022-08-15 NOTE — Progress Notes (Signed)
Addendum: Reviewed and agree with assessment and management plan. Hurbert Duran M, MD  

## 2022-08-16 ENCOUNTER — Encounter: Payer: Self-pay | Admitting: Internal Medicine

## 2022-08-16 ENCOUNTER — Ambulatory Visit (AMBULATORY_SURGERY_CENTER): Payer: BC Managed Care – PPO | Admitting: Internal Medicine

## 2022-08-16 VITALS — BP 150/94 | HR 85 | Temp 98.1°F | Resp 13 | Ht 72.0 in | Wt 253.0 lb

## 2022-08-16 DIAGNOSIS — Z86010 Personal history of colonic polyps: Secondary | ICD-10-CM | POA: Diagnosis not present

## 2022-08-16 DIAGNOSIS — D124 Benign neoplasm of descending colon: Secondary | ICD-10-CM | POA: Diagnosis not present

## 2022-08-16 DIAGNOSIS — Z09 Encounter for follow-up examination after completed treatment for conditions other than malignant neoplasm: Secondary | ICD-10-CM | POA: Diagnosis not present

## 2022-08-16 DIAGNOSIS — K635 Polyp of colon: Secondary | ICD-10-CM | POA: Diagnosis not present

## 2022-08-16 DIAGNOSIS — Z1211 Encounter for screening for malignant neoplasm of colon: Secondary | ICD-10-CM | POA: Diagnosis not present

## 2022-08-16 DIAGNOSIS — D123 Benign neoplasm of transverse colon: Secondary | ICD-10-CM | POA: Diagnosis not present

## 2022-08-16 MED ORDER — SODIUM CHLORIDE 0.9 % IV SOLN: 500.0000 mL | Freq: Once | INTRAVENOUS | Status: DC

## 2022-08-16 NOTE — Progress Notes (Signed)
Pt resting comfortably. VSS. Airway intact. SBAR complete to RN. All questions answered.   

## 2022-08-16 NOTE — Progress Notes (Signed)
Pt's states no medical or surgical changes since previsit or office visit. VS assessed by AS 

## 2022-08-16 NOTE — Op Note (Signed)
[facility] Patient Name: Dustin Black Procedure Date: 08/16/2022 2:40 PM MRN: [id number] Endoscopist: Jerene Bears , MD Age: 61 Referring MD:  Date of Birth: 1961/03/14 Gender: Male Account #: [id number] Procedure:                Colonoscopy Indications:              High risk colon cancer surveillance: Personal                            history of colonic polyps, last exam at [facility] approx. 6 yrs ago Medicines:                Monitored Anesthesia Care Procedure:                Pre-Anesthesia Assessment:                           - Prior to the procedure, a History and Physical                            was performed, and patient medications and                            allergies were reviewed. The patient's tolerance of                            previous anesthesia was also reviewed. The risks                            and benefits of the procedure and the sedation                            options and risks were discussed with the patient.                            All questions were answered, and informed consent                            was obtained. Prior Anticoagulants: The patient has                            taken no previous anticoagulant or antiplatelet                            agents. ASA Grade Assessment: II - A patient with                            mild systemic disease. After reviewing the risks                            and benefits, the patient was deemed in  satisfactory condition to undergo the procedure.                           After obtaining informed consent, the colonoscope                            was passed under direct vision. Throughout the                            procedure, the patient's blood pressure, pulse, and                            oxygen saturations were monitored continuously. The                            CF [id number] #[id number] was introduced through the  anus                            and advanced to the cecum, identified by                            appendiceal orifice and ileocecal valve. The                            colonoscopy was performed without difficulty. The                            patient tolerated the procedure well. The quality                            of the bowel preparation was good. The ileocecal                            valve, appendiceal orifice, and rectum were                            photographed. Scope In: 2:58:04 PM Scope Out: 3:16:41 PM Scope Withdrawal Time: 0 hours 15 minutes 2 seconds  Total Procedure Duration: 0 hours 18 minutes 37 seconds  Findings:                 The digital rectal exam was normal.                           Two sessile polyps were found in the transverse                            colon. The polyps were 3 to 5 mm in size. These                            polyps were removed with a cold snare. Resection                            and retrieval were complete.  A 5 mm polyp was found in the descending colon. The                            polyp was sessile. The polyp was removed with a                            cold snare. Resection and retrieval were complete.                           Multiple small-mouthed diverticula were found in                            the sigmoid colon.                           Internal hemorrhoids were found during                            retroflexion. The hemorrhoids were small. Complications:            No immediate complications. Estimated Blood Loss:     Estimated blood loss was minimal. Impression:               - Two 3 to 5 mm polyps in the transverse colon,                            removed with a cold snare. Resected and retrieved.                           - One 5 mm polyp in the descending colon, removed                            with a cold snare. Resected and retrieved.                           -  Diverticulosis in the sigmoid colon.                           - Internal hemorrhoids. Recommendation:           - Patient has a contact number available for                            emergencies. The signs and symptoms of potential                            delayed complications were discussed with the                            patient. Return to normal activities tomorrow.                            Written discharge instructions were provided to the  patient.                           - Resume previous diet.                           - Continue present medications.                           - Await pathology results.                           - Repeat colonoscopy is recommended for                            surveillance. The colonoscopy date will be                            determined after pathology results from today's                            exam become available for review. Jerene Bears, MD 08/16/2022 3:19:29 PM This report has been signed electronically.

## 2022-08-16 NOTE — Patient Instructions (Signed)
Read all of the handouts given to you by your recovery room nurse.  YOU HAD AN ENDOSCOPIC PROCEDURE TODAY AT [facility]:   Refer to the procedure report that was given to you for any specific questions about what was found during the examination.  If the procedure report does not answer your questions, please call your gastroenterologist to clarify.  If you requested that your care partner not be given the details of your procedure findings, then the procedure report has been included in a sealed envelope for you to review at your convenience later.  YOU SHOULD EXPECT: Some feelings of bloating in the abdomen. Passage of more gas than usual.  Walking can help get rid of the air that was put into your GI tract during the procedure and reduce the bloating. If you had a lower endoscopy (such as a colonoscopy or flexible sigmoidoscopy) you may notice spotting of blood in your stool or on the toilet paper. If you underwent a bowel prep for your procedure, you may not have a normal bowel movement for a few days.  Please Note:  You might notice some irritation and congestion in your nose or some drainage.  This is from the oxygen used during your procedure.  There is no need for concern and it should clear up in a day or so.  SYMPTOMS TO REPORT IMMEDIATELY:  Following lower endoscopy (colonoscopy or flexible sigmoidoscopy):  Excessive amounts of blood in the stool  Significant tenderness or worsening of abdominal pains  Swelling of the abdomen that is new, acute  Fever of 100F or higher   For urgent or emergent issues, a gastroenterologist can be reached at any hour by calling [phone]. Do not use MyChart messaging for urgent concerns.    DIET:  We do recommend a small meal at first, but then you may proceed to your regular diet.  Drink plenty of fluids but you should avoid alcoholic beverages for 24 hours.  Try to increase the fiber in your diet, and drink plenty of water.     ACTIVITY:  You should plan to take it easy for the rest of today and you should NOT DRIVE or use heavy machinery until tomorrow (because of the sedation medicines used during the test).    FOLLOW UP: Our staff will call the number listed on your records the next business day following your procedure.  We will call around 7:15- 8:00 am to check on you and address any questions or concerns that you may have regarding the information given to you following your procedure. If we do not reach you, we will leave a message.     If any biopsies were taken you will be contacted by phone or by letter within the next 1-3 weeks.  Please call us at [phone] if you have not heard about the biopsies in 3 weeks.    SIGNATURES/CONFIDENTIALITY: You and/or your care partner have signed paperwork which will be entered into your electronic medical record.  These signatures attest to the fact that that the information above on your After Visit Summary has been reviewed and is understood.  Full responsibility of the confidentiality of this discharge information lies with you and/or your care-partner.

## 2022-08-16 NOTE — Progress Notes (Signed)
See office note dated 08/13/2022 for details and current H&P  Patient presenting for colonoscopy for history of polyps and family history of colon cancer in 2 second-degree relatives.  He remains appropriate for colonoscopy here today

## 2022-08-19 ENCOUNTER — Telehealth: Payer: Self-pay | Admitting: *Deleted

## 2022-08-19 NOTE — Telephone Encounter (Signed)
  Follow up Call-     08/16/2022    1:56 PM  Call back number  Post procedure Call Back phone  # [phone]  Permission to leave phone message Yes     Patient questions:  Do you have a fever, pain , or abdominal swelling? No. Pain Score  0 *  Have you tolerated food without any problems? Yes.    Have you been able to return to your normal activities? Yes.    Do you have any questions about your discharge instructions: Diet   No. Medications  No. Follow up visit  No.  Do you have questions or concerns about your Care? No.  Actions: * If pain score is 4 or above: No action needed, pain <4.

## 2022-08-26 ENCOUNTER — Encounter: Payer: Self-pay | Admitting: Internal Medicine

## 2022-09-10 ENCOUNTER — Ambulatory Visit (INDEPENDENT_AMBULATORY_CARE_PROVIDER_SITE_OTHER): Payer: BC Managed Care – PPO | Admitting: Family Medicine

## 2022-09-10 ENCOUNTER — Encounter: Payer: Self-pay | Admitting: Family Medicine

## 2022-09-10 VITALS — BP 122/82 | HR 89 | Temp 98.2°F | Ht 72.0 in | Wt 243.2 lb

## 2022-09-10 DIAGNOSIS — E119 Type 2 diabetes mellitus without complications: Secondary | ICD-10-CM

## 2022-09-10 DIAGNOSIS — N528 Other male erectile dysfunction: Secondary | ICD-10-CM | POA: Diagnosis not present

## 2022-09-10 DIAGNOSIS — I10 Essential (primary) hypertension: Secondary | ICD-10-CM

## 2022-09-10 DIAGNOSIS — K219 Gastro-esophageal reflux disease without esophagitis: Secondary | ICD-10-CM | POA: Diagnosis not present

## 2022-09-10 DIAGNOSIS — F411 Generalized anxiety disorder: Secondary | ICD-10-CM

## 2022-09-10 MED ORDER — OZEMPIC (2 MG/DOSE) 8 MG/3ML ~~LOC~~ SOPN: 2.0000 mg | PEN_INJECTOR | SUBCUTANEOUS | 5 refills | Status: DC

## 2022-09-10 NOTE — Progress Notes (Signed)
Subjective:     Patient ID: Dustin Black, male    DOB: Apr 03, 1961, 61 y.o.   MRN: [id number]  Chief Complaint  Patient presents with   Follow-up    HPI  HTN-Pt is on amlodipine/coreg .  Bp's running  120's/80.  No ha/dizziness/cp/palp/edema/cough/sob  GERD-PPI daily.  No dysphagia, no dark stools ED-viagra works well DM-ozempic weekly.  On 1 mg for 3 wks. No nausea.  Tired day after taking. No d/c. Not checking sugars.  Considering monitor.   Ophth-never. Anxiety-doing well on lexapro.  No SI  There are no preventive care reminders to display for this patient.   Past Medical History:  Diagnosis Date   Anxiety    Depression    DM (diabetes mellitus) ([place])    GERD (gastroesophageal reflux disease)    History of chickenpox    Hyperlipidemia    Hypertension    Thyroid disease 01/23/2014   I hade a module and it was to small to test. My sister had thyroid cancer.    Past Surgical History:  Procedure Laterality Date   ESOPHAGEAL DILATION      Outpatient Medications Prior to Visit  Medication Sig Dispense Refill   amLODipine (NORVASC) 10 MG tablet TAKE 1 TABLET BY MOUTH EVERY DAY 90 tablet 3   aspirin 81 MG tablet Take 81 mg by mouth daily.     carvedilol (COREG) 25 MG tablet Take 1 tablet (25 mg total) by mouth 2 (two) times daily with a meal. TAKE 1 TABLET BY MOUTH 2 (TWO) TIMES DAILY WITH A MEAL. 180 tablet 3   escitalopram (LEXAPRO) 20 MG tablet Take 1 tablet (20 mg total) by mouth daily. 90 tablet 3   sildenafil (VIAGRA) 100 MG tablet Take 100 mg by mouth daily as needed.     Semaglutide, 1 MG/DOSE, 4 MG/3ML SOPN Inject 1 mg as directed once a week. 3 mL 2   omeprazole (PRILOSEC) 20 MG capsule Take 20 mg by mouth daily. (Patient not taking: Reported on 08/16/2022)     sildenafil (VIAGRA) 100 MG tablet Take 100 mg by mouth daily as needed. (Patient not taking: Reported on 08/16/2022)     No facility-administered medications prior to visit.    No Known Allergies ROS  neg/noncontributory except as noted HPI/below      Objective:     BP 122/82 (BP Location: Left Arm, Patient Position: Sitting, Cuff Size: Large)   Pulse 89   Temp 98.2 F (36.8 C) (Temporal)   Ht 6' (1.829 m)   Wt 243 lb 3.2 oz (110.3 kg)   SpO2 96%   BMI 32.98 kg/m  Wt Readings from Last 3 Encounters:  09/10/22 243 lb 3.2 oz (110.3 kg)  08/16/22 253 lb (114.8 kg)  08/13/22 253 lb (114.8 kg)    Physical Exam   Gen: WDWN NAD OWM HEENT: NCAT, conjunctiva not injected, sclera nonicteric NECK:  supple, no thyromegaly, no nodes, no carotid bruits CARDIAC: RRR, S1S2+, no murmur. DP 2+B LUNGS: CTAB. No wheezes ABDOMEN:  BS+, soft, NTND, No HSM, no masses EXT:  no edema MSK: no gross abnormalities.  NEURO: A&O x3.  CN II-XII intact.  PSYCH: normal mood. Good eye contact     Assessment & Plan:   Problem List Items Addressed This Visit       Cardiovascular and Mediastinum   Essential hypertension   Relevant Medications   sildenafil (VIAGRA) 100 MG tablet     Digestive   Gastroesophageal reflux disease  Endocrine   Type 2 diabetes mellitus without complication, without long-term current use of insulin (HCC) - Primary   Relevant Medications   Semaglutide, 2 MG/DOSE, (OZEMPIC, 2 MG/DOSE,) 8 MG/3ML SOPN     Other   Other male erectile dysfunction   GAD (generalized anxiety disorder)  1  DM type 2 w/hyperglycemia-chronic.  Working on diet/exercise.  Continue.  Increase ozempic to '2mg'$ .  Has f/u in Jan.  Get ophth done.  Pt will call insurance w/glucometer preference and check sev x/wk 2.  HTN-chronic.  Well controlled.  Cont amlodipine '10mg'$  and coreg '25mg'$  bid 3.  ED-chronic.  Controlled on viagra-gets from [place] 4.  GERD-chronic.  Controlled on omeprazole '20mg'$  daily cont 5.  GAD-chronic.  Well controlled on lexapro '20mg'$ .  Cont.    Meds ordered this encounter  Medications   Semaglutide, 2 MG/DOSE, (OZEMPIC, 2 MG/DOSE,) 8 MG/3ML SOPN    Sig: Inject 2 mg into the  skin once a week.    Dispense:  3 mL    Refill:  5    Wellington Hampshire, MD

## 2022-09-10 NOTE — Patient Instructions (Addendum)
It was very nice to see you today!  Call insurance for brand of monitor for sugars and let me know.  Check 1-3x/wk  Increase ozempic to 2 mg when done w/'1mg'$   PLEASE NOTE:  If you had any lab tests please let us know if you have not heard back within a few days. You may see your results on MyChart before we have a chance to review them but we will give you a call once they are reviewed by [place]. If we ordered any referrals today, please let us know if you have not heard from their office within the next week.   Please try these tips to maintain a healthy lifestyle:  Eat most of your calories during the day when you are active. Eliminate processed foods including packaged sweets (pies, cakes, cookies), reduce intake of potatoes, white bread, white pasta, and white rice. Look for whole grain options, oat flour or almond flour.  Each meal should contain half fruits/vegetables, one quarter protein, and one quarter carbs (no bigger than a computer mouse).  Cut down on sweet beverages. This includes juice, soda, and sweet tea. Also watch fruit intake, though this is a healthier sweet option, it still contains natural sugar! Limit to 3 servings daily.  Drink at least 1 glass of water with each meal and aim for at least 8 glasses per day  Exercise at least 150 minutes every week.

## 2022-10-14 ENCOUNTER — Encounter: Payer: Self-pay | Admitting: Family Medicine

## 2022-10-14 ENCOUNTER — Other Ambulatory Visit: Payer: Self-pay | Admitting: Family Medicine

## 2022-10-14 MED ORDER — SEMAGLUTIDE (1 MG/DOSE) 4 MG/3ML ~~LOC~~ SOPN: 2.0000 mg | PEN_INJECTOR | SUBCUTANEOUS | 1 refills | Status: DC

## 2022-11-28 ENCOUNTER — Encounter: Payer: Self-pay | Admitting: Family Medicine

## 2022-11-28 ENCOUNTER — Ambulatory Visit (INDEPENDENT_AMBULATORY_CARE_PROVIDER_SITE_OTHER): Payer: BC Managed Care – PPO | Admitting: Family Medicine

## 2022-11-28 VITALS — BP 124/70 | HR 106 | Temp 98.3°F | Ht 72.0 in | Wt 242.5 lb

## 2022-11-28 DIAGNOSIS — I10 Essential (primary) hypertension: Secondary | ICD-10-CM

## 2022-11-28 DIAGNOSIS — E119 Type 2 diabetes mellitus without complications: Secondary | ICD-10-CM

## 2022-11-28 DIAGNOSIS — Z79899 Other long term (current) drug therapy: Secondary | ICD-10-CM | POA: Diagnosis not present

## 2022-11-28 LAB — COMPREHENSIVE METABOLIC PANEL
ALT: 20 U/L (ref 0–53)
AST: 20 U/L (ref 0–37)
Albumin: 4.4 g/dL (ref 3.5–5.2)
Alkaline Phosphatase: 81 U/L (ref 39–117)
BUN: 11 mg/dL (ref 6–23)
CO2: 21 mEq/L (ref 19–32)
Calcium: 9.6 mg/dL (ref 8.4–10.5)
Chloride: 102 mEq/L (ref 96–112)
Creatinine, Ser: 0.81 mg/dL (ref 0.40–1.50)
GFR: 95.07 mL/min (ref >60.00)
Glucose, Bld: 103 mg/dL — ABNORMAL HIGH (ref 70–99)
Potassium: 4.3 mEq/L (ref 3.5–5.1)
Sodium: 138 mEq/L (ref 135–145)
Total Bilirubin: 0.4 mg/dL (ref 0.2–1.2)
Total Protein: 7.6 g/dL (ref 6.0–8.3)

## 2022-11-28 LAB — HEMOGLOBIN A1C: Hgb A1c MFr Bld: 6 % (ref 4.6–6.5)

## 2022-11-28 LAB — LIPID PANEL
Cholesterol: 192 mg/dL (ref 0–200)
HDL: 48.2 mg/dL (ref >39.00)
LDL Cholesterol: 114 mg/dL — ABNORMAL HIGH (ref 0–99)
NonHDL: 143.49
Total CHOL/HDL Ratio: 4
Triglycerides: 146 mg/dL (ref 0.0–149.0)
VLDL: 29.2 mg/dL (ref 0.0–40.0)

## 2022-11-28 LAB — CBC WITH DIFFERENTIAL/PLATELET
Basophils Absolute: 0 10*3/uL (ref 0.0–0.1)
Basophils Relative: 0.4 % (ref 0.0–3.0)
Eosinophils Absolute: 0 10*3/uL (ref 0.0–0.7)
Eosinophils Relative: 0.4 % (ref 0.0–5.0)
HCT: 45.6 % (ref 39.0–52.0)
Hemoglobin: 15.8 g/dL (ref 13.0–17.0)
Lymphocytes Relative: 10.5 % — ABNORMAL LOW (ref 12.0–46.0)
Lymphs Abs: 1.2 10*3/uL (ref 0.7–4.0)
MCHC: 34.6 g/dL (ref 30.0–36.0)
MCV: 91.4 fl (ref 78.0–100.0)
Monocytes Absolute: 0.6 10*3/uL (ref 0.1–1.0)
Monocytes Relative: 5.5 % (ref 3.0–12.0)
Neutro Abs: 9.7 10*3/uL — ABNORMAL HIGH (ref 1.4–7.7)
Neutrophils Relative %: 83.2 % — ABNORMAL HIGH (ref 43.0–77.0)
Platelets: 270 10*3/uL (ref 150.0–400.0)
RBC: 4.99 Mil/uL (ref 4.22–5.81)
RDW: 13.4 % (ref 11.5–15.5)
WBC: 11.6 10*3/uL — ABNORMAL HIGH (ref 4.0–10.5)

## 2022-11-28 LAB — VITAMIN B12: Vitamin B-12: 1124 pg/mL — ABNORMAL HIGH (ref 211–911)

## 2022-11-28 MED ORDER — OMEPRAZOLE 20 MG PO CPDR: 20.0000 mg | DELAYED_RELEASE_CAPSULE | Freq: Every day | ORAL | 3 refills | Status: DC

## 2022-11-28 MED ORDER — ROSUVASTATIN CALCIUM 20 MG PO TABS: 20.0000 mg | ORAL_TABLET | Freq: Every day | ORAL | 3 refills | Status: DC

## 2022-11-28 NOTE — Progress Notes (Addendum)
Subjective:     Patient ID: Dustin Black, male    DOB: 1961-07-14, 62 y.o.   MRN: [id number]  Chief Complaint  Patient presents with   Follow-up    6 month follow-up for HTN Fasting     HPI  HTN-Pt is on amlodipine '10mg'$ , coreg '25mg'$  Bp's running 120/80.  No ha/dizziness/cp/palp/edema/cough/sob.  Always has high pulse. Will sch opth  DM type 2-ozempic-had trouble getting ozempic.  On the '2mg'$  for 2 wks.  No SE.  No constipation/nausea.  Doesn't check sugars.  Not notice decreased appetite. Some exercise.    Health Maintenance Due  Topic Date Due   OPHTHALMOLOGY EXAM  Never done    Past Medical History:  Diagnosis Date   Anxiety    Depression    DM (diabetes mellitus) ([place])    GERD (gastroesophageal reflux disease)    History of chickenpox    Hyperlipidemia    Hypertension    Thyroid disease 01/23/2014   I hade a module and it was to small to test. My sister had thyroid cancer.    Past Surgical History:  Procedure Laterality Date   ESOPHAGEAL DILATION      Outpatient Medications Prior to Visit  Medication Sig Dispense Refill   amLODipine (NORVASC) 10 MG tablet TAKE 1 TABLET BY MOUTH EVERY DAY 90 tablet 3   aspirin 81 MG tablet Take 81 mg by mouth daily.     carvedilol (COREG) 25 MG tablet Take 1 tablet (25 mg total) by mouth 2 (two) times daily with a meal. TAKE 1 TABLET BY MOUTH 2 (TWO) TIMES DAILY WITH A MEAL. 180 tablet 3   escitalopram (LEXAPRO) 20 MG tablet Take 1 tablet (20 mg total) by mouth daily. 90 tablet 3   Semaglutide, 2 MG/DOSE, (OZEMPIC, 2 MG/DOSE,) 8 MG/3ML SOPN Inject 2 mg into the skin once a week. 3 mL 5   sildenafil (VIAGRA) 100 MG tablet Take 100 mg by mouth daily as needed.     omeprazole (PRILOSEC) 20 MG capsule Take 20 mg by mouth daily.     Semaglutide, 1 MG/DOSE, 4 MG/3ML SOPN Inject 2 mg into the skin once a week. 6 mL 1   No facility-administered medications prior to visit.    No Known Allergies ROS neg/noncontributory except as noted  HPI/below      Objective:     BP 124/70   Pulse (!) 106   Temp 98.3 F (36.8 C) (Temporal)   Ht 6' (1.829 m)   Wt 242 lb 8 oz (110 kg)   SpO2 98%   BMI 32.89 kg/m  Wt Readings from Last 3 Encounters:  11/28/22 242 lb 8 oz (110 kg)  09/10/22 243 lb 3.2 oz (110.3 kg)  08/16/22 253 lb (114.8 kg)    Physical Exam   Gen: WDWN NAD HEENT: NCAT, conjunctiva not injected, sclera nonicteric NECK:  supple, no thyromegaly, no nodes, no carotid bruits CARDIAC:tachy RRR, S1S2+, no murmur. DP 2+B LUNGS: CTAB. No wheezes ABDOMEN:  BS+, soft, NTND, No HSM, no masses EXT:  no edema MSK: no gross abnormalities.  NEURO: A&O x3.  CN II-XII intact.  PSYCH: normal mood. Good eye contact  Diabetic Foot Exam - Simple   Simple Foot Form Diabetic Foot exam was performed with the following findings: Yes 11/28/2022  9:37 AM  Visual Inspection No deformities, no ulcerations, no other skin breakdown bilaterally: Yes Sensation Testing Intact to touch and monofilament testing bilaterally: Yes Pulse Check Posterior Tibialis and Dorsalis pulse intact  bilaterally: Yes Comments         Assessment & Plan:   Problem List Items Addressed This Visit       Cardiovascular and Mediastinum   Essential hypertension   Relevant Medications   rosuvastatin (CRESTOR) 20 MG tablet   Other Relevant Orders   CBC with Differential/Platelet (Completed)     Endocrine   Type 2 diabetes mellitus without complication, without long-term current use of insulin (HCC) - Primary   Relevant Medications   rosuvastatin (CRESTOR) 20 MG tablet   Other Relevant Orders   Comprehensive metabolic panel (Completed)   Hemoglobin A1c (Completed)   Lipid panel (Completed)   Other Visit Diagnoses     High risk medication use       Relevant Orders   Vitamin B12 (Completed)     1.  Hypertension- chronic.  Well-controlled.  Continue amlodipine 10 mg and carvedilol 25 mg twice daily.  Check CBC.  Follow-up in 6 months 2.   Type 2 diabetes-chronic.  Was uncontrolled with hyperglycemia.  He is working some on diet and exercise.  Doing well on Ozempic 2 mg.  (He is having some trouble obtaining the medicine due to shortages).  Foot exam was done today.  Advised to schedule eye exam.  add rosuvastatin 20 mg.  Check CMP, A1c, lipids 3.  High risk meds-check vitamin B12 due to chronic PPI  Meds ordered this encounter  Medications   omeprazole (PRILOSEC) 20 MG capsule    Sig: Take 1 capsule (20 mg total) by mouth daily.    Dispense:  90 capsule    Refill:  3   rosuvastatin (CRESTOR) 20 MG tablet    Sig: Take 1 tablet (20 mg total) by mouth daily.    Dispense:  90 tablet    Refill:  3    Wellington Hampshire, MD

## 2022-11-28 NOTE — Patient Instructions (Signed)
It was very nice to see you today!  Work on diet/exercise   PLEASE NOTE:  If you had any lab tests please let us know if you have not heard back within a few days. You may see your results on MyChart before we have a chance to review them but we will give you a call once they are reviewed by us. If we ordered any referrals today, please let us know if you have not heard from their office within the next week.   Please try these tips to maintain a healthy lifestyle:  Eat most of your calories during the day when you are active. Eliminate processed foods including packaged sweets (pies, cakes, cookies), reduce intake of potatoes, white bread, white pasta, and white rice. Look for whole grain options, oat flour or almond flour.  Each meal should contain half fruits/vegetables, one quarter protein, and one quarter carbs (no bigger than a computer mouse).  Cut down on sweet beverages. This includes juice, soda, and sweet tea. Also watch fruit intake, though this is a healthier sweet option, it still contains natural sugar! Limit to 3 servings daily.  Drink at least 1 glass of water with each meal and aim for at least 8 glasses per day  Exercise at least 150 minutes every week.   

## 2022-11-28 NOTE — Progress Notes (Signed)
1.  DM-at goal!  Continue ozempic, diet/exercise.   2.  Cholesterol still too high but we just added crestor 3.  WBC-slightly elevated and he wasn't sick.  Repeat cbc 2 months and also lft's since starting chol med and doing labs anyway

## 2022-11-29 ENCOUNTER — Other Ambulatory Visit: Payer: Self-pay | Admitting: *Deleted

## 2022-11-29 DIAGNOSIS — D72829 Elevated white blood cell count, unspecified: Secondary | ICD-10-CM

## 2022-12-27 ENCOUNTER — Encounter: Payer: Self-pay | Admitting: Family Medicine

## 2023-03-10 ENCOUNTER — Ambulatory Visit (INDEPENDENT_AMBULATORY_CARE_PROVIDER_SITE_OTHER)
Admission: RE | Admit: 2023-03-10 | Discharge: 2023-03-10 | Disposition: A | Discharge status: Home / Self Care | Payer: BC Managed Care – PPO | Source: Ambulatory Visit | Attending: Family Medicine | Admitting: Family Medicine

## 2023-03-10 ENCOUNTER — Ambulatory Visit (INDEPENDENT_AMBULATORY_CARE_PROVIDER_SITE_OTHER): Payer: BC Managed Care – PPO | Admitting: Family Medicine

## 2023-03-10 ENCOUNTER — Encounter: Payer: Self-pay | Admitting: Family Medicine

## 2023-03-10 VITALS — BP 110/64 | HR 90 | Temp 97.8°F | Ht 72.0 in | Wt 245.2 lb

## 2023-03-10 DIAGNOSIS — M25561 Pain in right knee: Secondary | ICD-10-CM | POA: Diagnosis not present

## 2023-03-10 MED ORDER — DICLOFENAC SODIUM 75 MG PO TBEC: 75.0000 mg | DELAYED_RELEASE_TABLET | Freq: Two times a day (BID) | ORAL | 0 refills | Status: AC

## 2023-03-10 NOTE — Progress Notes (Signed)
Subjective  CC:  Chief Complaint  Patient presents with   Joint Swelling    Pt stated that he has been having Rt knee pain for the past 2 weeks. He is not sue of how it may have happened.  He has been applying ice which does help with pain.   Same day acute visit; PCP not available. New pt to me. Chart reviewed.   HPI: Dustin Black is a 62 y.o. male who presents to the office today to address the problems listed above in the chief complaint. Pleasant 62 year old male with type 2 diabetes presents due to 2-week history of acute right knee pain.  Noted abruptly when standing out of his recliner after sitting for about an hour.  Pain was sharp and caused him to limp.  No injury.  He has noted some intermittent swelling in the medial knee.  No warmth or redness.  Pain has persisted in spite of icing and occasional ibuprofen.  No locking or give way.  Hurts more with walking, climbing or descending stairs.  Feels okay with sitting.  No hip pain.  Has history of left knee pain back in 2019.  Assessment  1. Right medial knee pain      Plan  Right knee pain medially: Differential diagnosis includes DJD, meniscal injury, plica syndrome, patellofemoral.  We discussed possible etiologies.  Will start workup with x-rays, recommend ice, knee sleeve and 2-week course of Voltaren.  Discussed risks and benefits.  He will follow-up here if not improving.  At that time can consider further treatments like steroid injection, MRI and/or physical therapy.  Follow up: 2 weeks if needed 05/30/2023  Orders Placed This Encounter  Procedures   DG Knee Complete 4 Views Right   Meds ordered this encounter  Medications   diclofenac (VOLTAREN) 75 MG EC tablet    Sig: Take 1 tablet (75 mg total) by mouth 2 (two) times daily.    Dispense:  30 tablet    Refill:  0      I reviewed the patients updated PMH, FH, and SocHx.    Patient Active Problem List   Diagnosis Date Noted   Other male erectile  dysfunction 09/10/2022   GAD (generalized anxiety disorder) 09/10/2022   Type 2 diabetes mellitus without complication, without long-term current use of insulin (HCC) 06/10/2022   Prediabetes 05/28/2022   Visit for preventive health examination 07/16/2018   Prostate cancer screening 07/16/2018   History of colon polyps 07/02/2018   Medial knee pain, left 07/02/2018   Essential hypertension 07/02/2018   Gastroesophageal reflux disease 02/11/2017   Current Meds  Medication Sig   amLODipine (NORVASC) 10 MG tablet TAKE 1 TABLET BY MOUTH EVERY DAY   aspirin 81 MG tablet Take 81 mg by mouth daily.   carvedilol (COREG) 25 MG tablet Take 1 tablet (25 mg total) by mouth 2 (two) times daily with a meal. TAKE 1 TABLET BY MOUTH 2 (TWO) TIMES DAILY WITH A MEAL.   diclofenac (VOLTAREN) 75 MG EC tablet Take 1 tablet (75 mg total) by mouth 2 (two) times daily.   escitalopram (LEXAPRO) 20 MG tablet Take 1 tablet (20 mg total) by mouth daily.   omeprazole (PRILOSEC) 20 MG capsule Take 1 capsule (20 mg total) by mouth daily.   rosuvastatin (CRESTOR) 20 MG tablet Take 1 tablet (20 mg total) by mouth daily.   Semaglutide, 2 MG/DOSE, (OZEMPIC, 2 MG/DOSE,) 8 MG/3ML SOPN Inject 2 mg into the skin once a week.  sildenafil (VIAGRA) 100 MG tablet Take 100 mg by mouth daily as needed.    Allergies: Patient has No Known Allergies. Family History: Patient family history includes Aneurysm in his father; Cancer in his father and sister; Depression in his mother and sister; Diabetes in his mother; Early death in his father; Heart attack in his mother; Hypertension in his mother. Social History:  Patient  reports that he quit smoking about 9 years ago. His smoking use included cigarettes. He has a 30.00 pack-year smoking history. He has never used smokeless tobacco. He reports current alcohol use of about 10.0 standard drinks of alcohol per week. He reports that he does not use drugs.  Review of  Systems: Constitutional: Negative for fever malaise or anorexia Cardiovascular: negative for chest pain Respiratory: negative for SOB or persistent cough Gastrointestinal: negative for abdominal pain  Objective  Vitals: BP 110/64   Pulse 90   Temp 97.8 F (36.6 C)   Ht 6' (1.829 m)   Wt 245 lb 3.2 oz (111.2 kg)   SpO2 92%   BMI 33.26 kg/m  General: no acute distress , A&Ox3 Limping Right knee appears normal, right medial joint line tenderness without palpable tender plica, no effusion, full range of motion with crepitus, negative Lachman's and McMurray's.  Able to squat unassisted   Commons side effects, risks, benefits, and alternatives for medications and treatment plan prescribed today were discussed, and the patient expressed understanding of the given instructions. Patient is instructed to call or message via MyChart if he/she has any questions or concerns regarding our treatment plan. No barriers to understanding were identified. We discussed Red Flag symptoms and signs in detail. Patient expressed understanding regarding what to do in case of urgent or emergency type symptoms.  Medication list was reconciled, printed and provided to the patient in AVS. Patient instructions and summary information was reviewed with the patient as documented in the AVS. This note was prepared with assistance of Dragon voice recognition software. Occasional wrong-word or sound-a-like substitutions may have occurred due to the inherent limitations of voice recognition software

## 2023-03-10 NOTE — Patient Instructions (Signed)
Please return in 2 weeks for recheck.   Ice your knee twice daily and take the medication.  Please go to our [facility] office to get your xrays done. You can walk in M-F between 8:30am- noon or 1pm - 5pm. Tell them you are there for xrays ordered by me. They will send me the results, then I will let you know the results with instructions.   Address: [address].  The Xray department is located in the basement.    If you have any questions or concerns, please don't hesitate to send me a message via MyChart or call the office at [phone]. Thank you for visiting with [place] today! It's our pleasure caring for you.   Acute Knee Pain, Adult Acute knee pain is sudden and may be caused by damage, swelling, or irritation of the muscles and tissues that support the knee. Pain may result from: A fall. An injury to the knee from twisting motions. A hit to the knee. Infection. Acute knee pain may go away on its own with time and rest. If it does not, your health care provider may order tests to find the cause of the pain. These may include: Imaging tests, such as an X-ray, MRI, CT scan, or ultrasound. Joint aspiration. In this test, fluid is removed from the knee and evaluated. Arthroscopy. In this test, a lighted tube is inserted into the knee and an image is projected onto a TV screen. Biopsy. In this test, a sample of tissue is removed from the body and studied under a microscope. Follow these instructions at home: If you have a knee sleeve or brace:  Wear the knee sleeve or brace as told by your health care provider. Remove it only as told by your health care provider. Loosen it if your toes tingle, become numb, or turn cold and blue. Keep it clean. If the knee sleeve or brace is not waterproof: Do not let it get wet. Cover it with a watertight covering when you take a bath or shower. Activity Rest your knee. Do not do things that cause pain or make pain worse. Avoid  high-impact activities or exercises, such as running, jumping rope, or doing jumping jacks. Work with a physical therapist to make a safe exercise program, as recommended by your health care provider. Do exercises as told by your physical therapist. Managing pain, stiffness, and swelling  If directed, put ice on the affected knee. To do this: If you have a removable knee sleeve or brace, remove it as told by your health care provider. Put ice in a plastic bag. Place a towel between your skin and the bag. Leave the ice on for 20 minutes, 2-3 times a day. Remove the ice if your skin turns bright red. This is very important. If you cannot feel pain, heat, or cold, you have a greater risk of damage to the area. If directed, use an elastic bandage to put pressure (compression) on your injured knee. This may control swelling, give support, and help with discomfort. Raise (elevate) your knee above the level of your heart while you are sitting or lying down. Sleep with a pillow under your knee. General instructions Take over-the-counter and prescription medicines only as told by your health care provider. Do not use any products that contain nicotine or tobacco, such as cigarettes, e-cigarettes, and chewing tobacco. If you need help quitting, ask your health care provider. If you are overweight, work with your health care  provider and a dietitian to set a weight-loss goal that is healthy and reasonable for you. Extra weight can put pressure on your knee. Pay attention to any changes in your symptoms. Keep all follow-up visits. This is important. Contact a health care provider if: Your knee pain continues, changes, or gets worse. You have a fever along with knee pain. Your knee feels warm to the touch or is red. Your knee buckles or locks up. Get help right away if: Your knee swells, and the swelling becomes worse. You cannot move your knee. You have severe pain in your knee that cannot be managed  with pain medicine. Summary Acute knee pain can be caused by a fall, an injury, an infection, or damage, swelling, or irritation of the tissues that support your knee. Your health care provider may perform tests to find out the cause of the pain. Pay attention to any changes in your symptoms. Relieve your pain with rest, medicines, light activity, and the use of ice. Get help right away if your knee swells, you cannot move your knee, or you have severe pain that cannot be managed with medicine. This information is not intended to replace advice given to you by your health care provider. Make sure you discuss any questions you have with your health care provider. Document Revised: 04/12/2020 Document Reviewed: 04/12/2020 Elsevier Patient Education  2023 [employer].

## 2023-03-24 ENCOUNTER — Ambulatory Visit: Payer: BC Managed Care – PPO | Admitting: Family Medicine

## 2023-04-04 ENCOUNTER — Other Ambulatory Visit: Payer: Self-pay | Admitting: Family Medicine

## 2023-04-04 ENCOUNTER — Encounter: Payer: Self-pay | Admitting: Family Medicine

## 2023-04-04 MED ORDER — SILDENAFIL CITRATE 100 MG PO TABS: 100.0000 mg | ORAL_TABLET | Freq: Every day | ORAL | 3 refills | Status: DC | PRN

## 2023-04-24 ENCOUNTER — Other Ambulatory Visit: Payer: Self-pay | Admitting: Family Medicine

## 2023-05-09 ENCOUNTER — Other Ambulatory Visit: Payer: Self-pay | Admitting: Family Medicine

## 2023-05-09 DIAGNOSIS — E119 Type 2 diabetes mellitus without complications: Secondary | ICD-10-CM

## 2023-05-09 MED ORDER — OZEMPIC (2 MG/DOSE) 8 MG/3ML ~~LOC~~ SOPN: 2.0000 mg | PEN_INJECTOR | SUBCUTANEOUS | 0 refills | Status: DC

## 2023-05-22 ENCOUNTER — Other Ambulatory Visit: Payer: Self-pay | Admitting: Family Medicine

## 2023-05-22 DIAGNOSIS — I10 Essential (primary) hypertension: Secondary | ICD-10-CM

## 2023-05-26 ENCOUNTER — Other Ambulatory Visit: Payer: Self-pay | Admitting: Family Medicine

## 2023-05-26 ENCOUNTER — Encounter: Payer: Self-pay | Admitting: Family Medicine

## 2023-05-26 MED ORDER — OZEMPIC (2 MG/DOSE) 8 MG/3ML ~~LOC~~ SOPN: 2.0000 mg | PEN_INJECTOR | SUBCUTANEOUS | 0 refills | Status: DC

## 2023-05-30 ENCOUNTER — Ambulatory Visit (INDEPENDENT_AMBULATORY_CARE_PROVIDER_SITE_OTHER): Payer: BC Managed Care – PPO | Admitting: Family Medicine

## 2023-05-30 ENCOUNTER — Encounter: Payer: Self-pay | Admitting: Family Medicine

## 2023-05-30 VITALS — BP 124/74 | HR 94 | Temp 98.3°F | Resp 18 | Ht 72.0 in | Wt 234.4 lb

## 2023-05-30 DIAGNOSIS — Z7985 Long-term (current) use of injectable non-insulin antidiabetic drugs: Secondary | ICD-10-CM

## 2023-05-30 DIAGNOSIS — Z125 Encounter for screening for malignant neoplasm of prostate: Secondary | ICD-10-CM

## 2023-05-30 DIAGNOSIS — E119 Type 2 diabetes mellitus without complications: Secondary | ICD-10-CM

## 2023-05-30 DIAGNOSIS — N528 Other male erectile dysfunction: Secondary | ICD-10-CM | POA: Diagnosis not present

## 2023-05-30 DIAGNOSIS — K219 Gastro-esophageal reflux disease without esophagitis: Secondary | ICD-10-CM | POA: Diagnosis not present

## 2023-05-30 DIAGNOSIS — F411 Generalized anxiety disorder: Secondary | ICD-10-CM

## 2023-05-30 DIAGNOSIS — I10 Essential (primary) hypertension: Secondary | ICD-10-CM

## 2023-05-30 MED ORDER — OZEMPIC (2 MG/DOSE) 8 MG/3ML ~~LOC~~ SOPN: 2.0000 mg | PEN_INJECTOR | SUBCUTANEOUS | 3 refills | Status: DC

## 2023-05-30 MED ORDER — AMLODIPINE BESYLATE 10 MG PO TABS: ORAL_TABLET | ORAL | 3 refills | Status: DC

## 2023-05-30 NOTE — Progress Notes (Signed)
Subjective:     Patient ID: Dustin Black, male    DOB: 1961-01-17, 62 y.o.   MRN: [id number]  Chief Complaint  Patient presents with   Medical Management of Chronic Issues    6 month follow-up on dm, htn Fasting Need refill of Ozempic    HPI  HTN-Pt is on amlodipine 10mg , coreg 25mg  Bp's running 120/80.  No ha/dizziness/cp/palp/edema/cough/sob.  Always has high pulse. Will sch opth  DM type 2-ozempic-  On the 2mg .  No SE.  No constipation/nausea.  Doesn't check sugars.  Not notice decreased appetite. Some exercise. Needs to sch eye exam. GERD-needs it bid.  No dysphagia, dark stools.   HLD-doing well on crestor 20mg .   Health Maintenance Due  Topic Date Due   OPHTHALMOLOGY EXAM  Never done   Diabetic kidney evaluation - Urine ACR  05/29/2023   HEMOGLOBIN A1C  05/29/2023    Past Medical History:  Diagnosis Date   Anxiety    Depression    DM (diabetes mellitus) (HCC)    GERD (gastroesophageal reflux disease)    History of chickenpox    Hyperlipidemia    Hypertension    Thyroid disease 01/23/2014   I hade a module and it was to small to test. My sister had thyroid cancer.    Past Surgical History:  Procedure Laterality Date   ESOPHAGEAL DILATION       Current Outpatient Medications:    aspirin 81 MG tablet, Take 81 mg by mouth daily., Disp: , Rfl:    carvedilol (COREG) 25 MG tablet, TAKE 1 TABLET (25 MG TOTAL) BY MOUTH 2 (TWO) TIMES DAILY WITH A MEAL., Disp: 180 tablet, Rfl: 3   diclofenac (VOLTAREN) 75 MG EC tablet, Take 1 tablet (75 mg total) by mouth 2 (two) times daily., Disp: 30 tablet, Rfl: 0   escitalopram (LEXAPRO) 20 MG tablet, TAKE 1 TABLET BY MOUTH EVERY DAY, Disp: 90 tablet, Rfl: 3   omeprazole (PRILOSEC) 20 MG capsule, TAKE 1 CAPSULE BY MOUTH EVERY DAY, Disp: 90 capsule, Rfl: 3   rosuvastatin (CRESTOR) 20 MG tablet, Take 1 tablet (20 mg total) by mouth daily., Disp: 90 tablet, Rfl: 3   sildenafil (VIAGRA) 100 MG tablet, Take 1 tablet (100 mg total) by  mouth daily as needed., Disp: 18 tablet, Rfl: 3   amLODipine (NORVASC) 10 MG tablet, TAKE 1 TABLET BY MOUTH EVERY DAY, Disp: 90 tablet, Rfl: 3   Semaglutide, 2 MG/DOSE, (OZEMPIC, 2 MG/DOSE,) 8 MG/3ML SOPN, Inject 2 mg into the skin once a week., Disp: 9 mL, Rfl: 3  No Known Allergies ROS neg/noncontributory except as noted HPI/below      Objective:     BP 124/74 (BP Location: Left Arm, Patient Position: Sitting, Cuff Size: Normal)   Pulse 94   Temp 98.3 F (36.8 C) (Temporal)   Resp 18   Ht 6' (1.829 m)   Wt 234 lb 6 oz (106.3 kg)   SpO2 98%   BMI 31.79 kg/m  Wt Readings from Last 3 Encounters:  05/30/23 234 lb 6 oz (106.3 kg)  03/10/23 245 lb 3.2 oz (111.2 kg)  11/28/22 242 lb 8 oz (110 kg)    Physical Exam   Gen: WDWN NAD HEENT: NCAT, conjunctiva not injected, sclera nonicteric NECK:  supple, no thyromegaly, no nodes, no carotid bruits CARDIAC: RRR, S1S2+, no murmur. DP 2+B LUNGS: CTAB. No wheezes ABDOMEN:  BS+, soft, NTND, No HSM, no masses EXT:  no edema MSK: no gross abnormalities.  NEURO:  A&O x3.  CN II-XII intact.  PSYCH: normal mood. Good eye contact     Assessment & Plan:  Type 2 diabetes mellitus without complication, without long-term current use of insulin (HCC) Assessment & Plan: Chronic.  Controlled.  Continue ozempic 2mg  weekly, diet exercise.  Get ophth sch.   Orders: -     Ozempic (2 MG/DOSE); Inject 2 mg into the skin once a week.  Dispense: 9 mL; Refill: 3 -     Lipid panel; Future -     Comprehensive metabolic panel; Future -     Hemoglobin A1c; Future -     TSH; Future -     Microalbumin / creatinine urine ratio; Future -     Urinalysis, Routine w reflex microscopic; Future  Essential hypertension Assessment & Plan: Chronic.  Controlled.  Continue amlodipine 10mg ,carvedilol 25mg  bid  Orders: -     amLODIPine Besylate; TAKE 1 TABLET BY MOUTH EVERY DAY  Dispense: 90 tablet; Refill: 3 -     Comprehensive metabolic panel; Future -      CBC with Differential/Platelet; Future -     Urinalysis, Routine w reflex microscopic; Future  Gastroesophageal reflux disease without esophagitis Assessment & Plan: Chronic.  Controlled.  Continue omeprazole 20mg  bid   Other male erectile dysfunction Assessment & Plan: Chronic.  Controlled.  Continue viagra 100mg    GAD (generalized anxiety disorder) Assessment & Plan: Chronic.  Controlled on lexapro 20mg  daily   Screening for prostate cancer -     PSA; Future  Long-term current use of injectable noninsulin antidiabetic medication    Return in about 6 months (around 11/30/2023) for HTN, cholesterol, DM-me 6 mo.  soon labs.  Angelena Sole, MD

## 2023-05-30 NOTE — Assessment & Plan Note (Signed)
Chronic.  Controlled.  Continue ozempic 2mg  weekly, diet exercise.  Get ophth sch.

## 2023-05-30 NOTE — Assessment & Plan Note (Signed)
Chronic.  Controlled.  Continue viagra 100mg 

## 2023-05-30 NOTE — Patient Instructions (Signed)

## 2023-05-30 NOTE — Assessment & Plan Note (Signed)
Chronic.  Controlled on lexapro 20mg  daily

## 2023-05-30 NOTE — Assessment & Plan Note (Signed)
Chronic.  Controlled.  Continue omeprazole 20mg  bid

## 2023-05-30 NOTE — Assessment & Plan Note (Signed)
Chronic.  Controlled.  Continue amlodipine 10mg ,carvedilol 25mg  bid

## 2023-06-03 ENCOUNTER — Other Ambulatory Visit (INDEPENDENT_AMBULATORY_CARE_PROVIDER_SITE_OTHER): Payer: BC Managed Care – PPO

## 2023-06-03 DIAGNOSIS — I10 Essential (primary) hypertension: Secondary | ICD-10-CM

## 2023-06-03 DIAGNOSIS — D72829 Elevated white blood cell count, unspecified: Secondary | ICD-10-CM | POA: Diagnosis not present

## 2023-06-03 DIAGNOSIS — E119 Type 2 diabetes mellitus without complications: Secondary | ICD-10-CM | POA: Diagnosis not present

## 2023-06-03 DIAGNOSIS — Z125 Encounter for screening for malignant neoplasm of prostate: Secondary | ICD-10-CM | POA: Diagnosis not present

## 2023-06-03 LAB — COMPREHENSIVE METABOLIC PANEL
ALT: 23 U/L (ref 0–53)
AST: 18 U/L (ref 0–37)
Albumin: 4.3 g/dL (ref 3.5–5.2)
Alkaline Phosphatase: 69 U/L (ref 39–117)
BUN: 10 mg/dL (ref 6–23)
CO2: 24 mEq/L (ref 19–32)
Calcium: 9.4 mg/dL (ref 8.4–10.5)
Chloride: 102 mEq/L (ref 96–112)
Creatinine, Ser: 0.87 mg/dL (ref 0.40–1.50)
GFR: 92.71 mL/min (ref >60.00)
Glucose, Bld: 105 mg/dL — ABNORMAL HIGH (ref 70–99)
Potassium: 3.7 mEq/L (ref 3.5–5.1)
Sodium: 136 mEq/L (ref 135–145)
Total Bilirubin: 0.6 mg/dL (ref 0.2–1.2)
Total Protein: 7.3 g/dL (ref 6.0–8.3)

## 2023-06-03 LAB — URINALYSIS, ROUTINE W REFLEX MICROSCOPIC
Bilirubin Urine: NEGATIVE
Hgb urine dipstick: NEGATIVE
Ketones, ur: NEGATIVE
Leukocytes,Ua: NEGATIVE
Nitrite: NEGATIVE
RBC / HPF: NONE SEEN (ref 0–?)
Specific Gravity, Urine: <=1.005 — AB (ref 1.000–1.030)
Total Protein, Urine: NEGATIVE
Urine Glucose: NEGATIVE
Urobilinogen, UA: 0.2 (ref 0.0–1.0)
WBC, UA: NONE SEEN (ref 0–?)
pH: 6.5 (ref 5.0–8.0)

## 2023-06-03 LAB — CBC WITH DIFFERENTIAL/PLATELET
Basophils Absolute: 0.1 10*3/uL (ref 0.0–0.1)
Basophils Relative: 0.9 % (ref 0.0–3.0)
Eosinophils Absolute: 0.1 10*3/uL (ref 0.0–0.7)
Eosinophils Relative: 1.3 % (ref 0.0–5.0)
HCT: 46.1 % (ref 39.0–52.0)
Hemoglobin: 15.3 g/dL (ref 13.0–17.0)
Lymphocytes Relative: 18.3 % (ref 12.0–46.0)
Lymphs Abs: 1.4 10*3/uL (ref 0.7–4.0)
MCHC: 33.1 g/dL (ref 30.0–36.0)
MCV: 94.6 fl (ref 78.0–100.0)
Monocytes Absolute: 0.8 10*3/uL (ref 0.1–1.0)
Monocytes Relative: 9.6 % (ref 3.0–12.0)
Neutro Abs: 5.5 10*3/uL (ref 1.4–7.7)
Neutrophils Relative %: 69.9 % (ref 43.0–77.0)
Platelets: 259 10*3/uL (ref 150.0–400.0)
RBC: 4.87 Mil/uL (ref 4.22–5.81)
RDW: 12.9 % (ref 11.5–15.5)
WBC: 7.9 10*3/uL (ref 4.0–10.5)

## 2023-06-03 LAB — HEMOGLOBIN A1C: Hgb A1c MFr Bld: 5.5 % (ref 4.6–6.5)

## 2023-06-03 LAB — HEPATIC FUNCTION PANEL
ALT: 23 U/L (ref 0–53)
AST: 18 U/L (ref 0–37)
Albumin: 4.3 g/dL (ref 3.5–5.2)
Alkaline Phosphatase: 69 U/L (ref 39–117)
Bilirubin, Direct: 0.1 mg/dL (ref 0.0–0.3)
Total Bilirubin: 0.6 mg/dL (ref 0.2–1.2)
Total Protein: 7.3 g/dL (ref 6.0–8.3)

## 2023-06-03 LAB — PSA: PSA: 0.69 ng/mL (ref 0.10–4.00)

## 2023-06-03 LAB — LIPID PANEL
Cholesterol: 142 mg/dL (ref 0–200)
HDL: 53.6 mg/dL (ref >39.00)
LDL Cholesterol: 71 mg/dL (ref 0–99)
NonHDL: 88.39
Total CHOL/HDL Ratio: 3
Triglycerides: 87 mg/dL (ref 0.0–149.0)
VLDL: 17.4 mg/dL (ref 0.0–40.0)

## 2023-06-03 LAB — MICROALBUMIN / CREATININE URINE RATIO
Creatinine,U: 33.4 mg/dL
Microalb Creat Ratio: 2.1 mg/g (ref 0.0–30.0)
Microalb, Ur: <0.7 mg/dL (ref 0.0–1.9)

## 2023-06-03 LAB — TSH: TSH: 0.69 u[IU]/mL (ref 0.35–5.50)

## 2023-06-05 NOTE — Progress Notes (Signed)
AWESOME!!!  Everything at goal!!   Same meds.  Keep up the great work!

## 2023-06-20 DIAGNOSIS — H5203 Hypermetropia, bilateral: Secondary | ICD-10-CM | POA: Diagnosis not present

## 2023-06-20 DIAGNOSIS — Z79899 Other long term (current) drug therapy: Secondary | ICD-10-CM | POA: Diagnosis not present

## 2023-06-20 DIAGNOSIS — E119 Type 2 diabetes mellitus without complications: Secondary | ICD-10-CM | POA: Diagnosis not present

## 2023-06-20 DIAGNOSIS — H52223 Regular astigmatism, bilateral: Secondary | ICD-10-CM | POA: Diagnosis not present

## 2023-06-23 ENCOUNTER — Encounter: Payer: Self-pay | Admitting: Family Medicine

## 2023-06-24 ENCOUNTER — Other Ambulatory Visit: Payer: Self-pay | Admitting: Family Medicine

## 2023-06-24 DIAGNOSIS — E119 Type 2 diabetes mellitus without complications: Secondary | ICD-10-CM

## 2023-06-30 ENCOUNTER — Other Ambulatory Visit: Payer: Self-pay | Admitting: Family Medicine

## 2023-06-30 DIAGNOSIS — I10 Essential (primary) hypertension: Secondary | ICD-10-CM

## 2023-06-30 NOTE — Telephone Encounter (Signed)
Spoke with pharmacy and was informed that this medication is already on file.

## 2023-07-20 ENCOUNTER — Other Ambulatory Visit: Payer: Self-pay | Admitting: Family Medicine

## 2023-07-20 DIAGNOSIS — E119 Type 2 diabetes mellitus without complications: Secondary | ICD-10-CM

## 2023-08-08 ENCOUNTER — Other Ambulatory Visit: Payer: Self-pay | Admitting: Family Medicine

## 2023-08-08 DIAGNOSIS — I10 Essential (primary) hypertension: Secondary | ICD-10-CM

## 2023-08-20 ENCOUNTER — Other Ambulatory Visit: Payer: Self-pay | Admitting: Family Medicine

## 2023-08-20 DIAGNOSIS — E119 Type 2 diabetes mellitus without complications: Secondary | ICD-10-CM

## 2023-10-03 DIAGNOSIS — M79631 Pain in right forearm: Secondary | ICD-10-CM | POA: Diagnosis not present

## 2023-10-03 DIAGNOSIS — S5001XA Contusion of right elbow, initial encounter: Secondary | ICD-10-CM | POA: Diagnosis not present

## 2023-10-03 DIAGNOSIS — S52301A Unspecified fracture of shaft of right radius, initial encounter for closed fracture: Secondary | ICD-10-CM | POA: Diagnosis not present

## 2023-10-03 DIAGNOSIS — I10 Essential (primary) hypertension: Secondary | ICD-10-CM | POA: Diagnosis not present

## 2023-10-07 DIAGNOSIS — M25521 Pain in right elbow: Secondary | ICD-10-CM | POA: Diagnosis not present

## 2023-10-07 DIAGNOSIS — M79631 Pain in right forearm: Secondary | ICD-10-CM | POA: Diagnosis not present

## 2023-10-21 DIAGNOSIS — M25531 Pain in right wrist: Secondary | ICD-10-CM | POA: Diagnosis not present

## 2023-10-21 DIAGNOSIS — M25521 Pain in right elbow: Secondary | ICD-10-CM | POA: Diagnosis not present

## 2023-11-01 ENCOUNTER — Other Ambulatory Visit: Payer: Self-pay | Admitting: Family Medicine

## 2023-11-10 ENCOUNTER — Encounter: Payer: Self-pay | Admitting: Family Medicine

## 2023-11-10 DIAGNOSIS — E119 Type 2 diabetes mellitus without complications: Secondary | ICD-10-CM

## 2023-12-12 ENCOUNTER — Ambulatory Visit (INDEPENDENT_AMBULATORY_CARE_PROVIDER_SITE_OTHER): Payer: BC Managed Care – PPO | Admitting: Family Medicine

## 2023-12-12 ENCOUNTER — Encounter: Payer: Self-pay | Admitting: Family Medicine

## 2023-12-12 ENCOUNTER — Other Ambulatory Visit: Payer: Self-pay | Admitting: *Deleted

## 2023-12-12 VITALS — BP 116/83 | HR 88 | Temp 97.8°F | Resp 16 | Ht 72.0 in | Wt 242.0 lb

## 2023-12-12 DIAGNOSIS — Z23 Encounter for immunization: Secondary | ICD-10-CM | POA: Diagnosis not present

## 2023-12-12 DIAGNOSIS — E785 Hyperlipidemia, unspecified: Secondary | ICD-10-CM | POA: Diagnosis not present

## 2023-12-12 DIAGNOSIS — E1169 Type 2 diabetes mellitus with other specified complication: Secondary | ICD-10-CM | POA: Insufficient documentation

## 2023-12-12 DIAGNOSIS — N528 Other male erectile dysfunction: Secondary | ICD-10-CM

## 2023-12-12 DIAGNOSIS — E876 Hypokalemia: Secondary | ICD-10-CM

## 2023-12-12 DIAGNOSIS — Z7985 Long-term (current) use of injectable non-insulin antidiabetic drugs: Secondary | ICD-10-CM

## 2023-12-12 DIAGNOSIS — K219 Gastro-esophageal reflux disease without esophagitis: Secondary | ICD-10-CM | POA: Diagnosis not present

## 2023-12-12 DIAGNOSIS — I10 Essential (primary) hypertension: Secondary | ICD-10-CM

## 2023-12-12 DIAGNOSIS — E119 Type 2 diabetes mellitus without complications: Secondary | ICD-10-CM

## 2023-12-12 DIAGNOSIS — F411 Generalized anxiety disorder: Secondary | ICD-10-CM

## 2023-12-12 LAB — CBC WITH DIFFERENTIAL/PLATELET
Basophils Absolute: 0.1 10*3/uL (ref 0.0–0.1)
Basophils Relative: 1.1 % (ref 0.0–3.0)
Eosinophils Absolute: 0.2 10*3/uL (ref 0.0–0.7)
Eosinophils Relative: 2.5 % (ref 0.0–5.0)
HCT: 44.7 % (ref 39.0–52.0)
Hemoglobin: 14.9 g/dL (ref 13.0–17.0)
Lymphocytes Relative: 19.4 % (ref 12.0–46.0)
Lymphs Abs: 1.5 10*3/uL (ref 0.7–4.0)
MCHC: 33.4 g/dL (ref 30.0–36.0)
MCV: 94.3 fL (ref 78.0–100.0)
Monocytes Absolute: 0.7 10*3/uL (ref 0.1–1.0)
Monocytes Relative: 8.6 % (ref 3.0–12.0)
Neutro Abs: 5.5 10*3/uL (ref 1.4–7.7)
Neutrophils Relative %: 68.4 % (ref 43.0–77.0)
Platelets: 261 10*3/uL (ref 150.0–400.0)
RBC: 4.74 Mil/uL (ref 4.22–5.81)
RDW: 13.5 % (ref 11.5–15.5)
WBC: 8 10*3/uL (ref 4.0–10.5)

## 2023-12-12 LAB — COMPREHENSIVE METABOLIC PANEL
ALT: 22 U/L (ref 0–53)
AST: 19 U/L (ref 0–37)
Albumin: 4.2 g/dL (ref 3.5–5.2)
Alkaline Phosphatase: 79 U/L (ref 39–117)
BUN: 11 mg/dL (ref 6–23)
CO2: 25 meq/L (ref 19–32)
Calcium: 9.1 mg/dL (ref 8.4–10.5)
Chloride: 102 meq/L (ref 96–112)
Creatinine, Ser: 0.92 mg/dL (ref 0.40–1.50)
GFR: 89.05 mL/min (ref >60.00)
Glucose, Bld: 109 mg/dL — ABNORMAL HIGH (ref 70–99)
Potassium: 3.4 meq/L — ABNORMAL LOW (ref 3.5–5.1)
Sodium: 137 meq/L (ref 135–145)
Total Bilirubin: 0.9 mg/dL (ref 0.2–1.2)
Total Protein: 7.3 g/dL (ref 6.0–8.3)

## 2023-12-12 LAB — HEMOGLOBIN A1C: Hgb A1c MFr Bld: 5.9 % (ref 4.6–6.5)

## 2023-12-12 LAB — LIPID PANEL
Cholesterol: 125 mg/dL (ref 0–200)
HDL: 51.3 mg/dL (ref >39.00)
LDL Cholesterol: 50 mg/dL (ref 0–99)
NonHDL: 73.57
Total CHOL/HDL Ratio: 2
Triglycerides: 120 mg/dL (ref 0.0–149.0)
VLDL: 24 mg/dL (ref 0.0–40.0)

## 2023-12-12 MED ORDER — TADALAFIL 20 MG PO TABS
10.0000 mg | ORAL_TABLET | ORAL | 3 refills | Status: AC | PRN
Start: 2023-12-12 — End: ?

## 2023-12-12 MED ORDER — POTASSIUM CHLORIDE ER 10 MEQ PO TBCR
10.0000 meq | EXTENDED_RELEASE_TABLET | Freq: Every day | ORAL | 1 refills | Status: DC
Start: 2023-12-12 — End: 2024-05-23

## 2023-12-12 NOTE — Assessment & Plan Note (Signed)
Chronic.  Controlled on lexapro 20mg  daily

## 2023-12-12 NOTE — Assessment & Plan Note (Signed)
Chronic.  Controlled.  Continue omeprazole 20mg  bid

## 2023-12-12 NOTE — Assessment & Plan Note (Signed)
Chronic.  Controlled.  Continue ozempic 2mg  weekly, diet exercise.

## 2023-12-12 NOTE — Assessment & Plan Note (Signed)
Chronic.  Controlled.  Continue crestor 20mg  daily

## 2023-12-12 NOTE — Progress Notes (Signed)
Subjective:     Patient ID: Dustin Black, male    DOB: 1961/06/06, 62 y.o.   MRN: [id number]  Chief Complaint  Patient presents with   Medical Management of Chronic Issues    6 month follow-up on htn, cholesterol Fasting     HPI  HTN-Pt is on amlodipine 10mg , coreg 25mg  Bp's running 120/80.  No ha/dizziness/cp/palp/edema/cough/sob.  Always has high pulse.   DM type 2-ozempic-  On the 2mg .  No SE.  No constipation/nausea.  Doesn't check sugars.  Not notice decreased appetite. Some exercise. Ophth utd GERD-needs it bid.  No dysphagia, dark stools,bleeding.   HLD-doing well on crestor 20mg .  GAD-doing well on Lexapro 20mg .  No SI ED-can't keep erections despite viagra-does ok w/200mg .   reports weight gain, attributing it to reduced mobility following an injury in November. He acknowledges the need for more exercise, noting that his wife is very active and he has access to exercise equipment at home  Health Maintenance Due  Topic Date Due   Lung Cancer Screening  05/01/2020   INFLUENZA VACCINE  08/20/2023   HEMOGLOBIN A1C  12/04/2023    Past Medical History:  Diagnosis Date   Anxiety    Depression    DM (diabetes mellitus) (HCC)    GERD (gastroesophageal reflux disease)    History of chickenpox    Hyperlipidemia    Hypertension    Thyroid disease 01/23/2014   I hade a module and it was to small to test. My sister had thyroid cancer.    Past Surgical History:  Procedure Laterality Date   ESOPHAGEAL DILATION       Current Outpatient Medications:    amLODipine (NORVASC) 10 MG tablet, TAKE 1 TABLET BY MOUTH EVERY DAY, Disp: 90 tablet, Rfl: 3   aspirin 81 MG tablet, Take 81 mg by mouth daily., Disp: , Rfl:    carvedilol (COREG) 25 MG tablet, TAKE 1 TABLET (25 MG TOTAL) BY MOUTH 2 (TWO) TIMES DAILY WITH A MEAL., Disp: 180 tablet, Rfl: 3   diclofenac (VOLTAREN) 75 MG EC tablet, Take 1 tablet (75 mg total) by mouth 2 (two) times daily., Disp: 30 tablet, Rfl: 0    escitalopram (LEXAPRO) 20 MG tablet, TAKE 1 TABLET BY MOUTH EVERY DAY, Disp: 90 tablet, Rfl: 3   omeprazole (PRILOSEC) 20 MG capsule, TAKE 1 CAPSULE BY MOUTH EVERY DAY, Disp: 90 capsule, Rfl: 3   rosuvastatin (CRESTOR) 20 MG tablet, TAKE 1 TABLET(20 MG) BY MOUTH DAILY, Disp: 90 tablet, Rfl: 3   Semaglutide, 2 MG/DOSE, (OZEMPIC, 2 MG/DOSE,) 8 MG/3ML SOPN, INJECT 2 MG INTO THE SKIN ONCE A WEEK., Disp: 9 mL, Rfl: 3   tadalafil (CIALIS) 20 MG tablet, Take 0.5-1 tablets (10-20 mg total) by mouth every other day as needed for erectile dysfunction., Disp: 30 tablet, Rfl: 3  No Known Allergies ROS neg/noncontributory except as noted HPI/below      Objective:     BP 116/83   Pulse 88   Temp 97.8 F (36.6 C) (Temporal)   Resp 16   Ht 6' (1.829 m)   Wt 242 lb (109.8 kg)   SpO2 98%   BMI 32.82 kg/m  Wt Readings from Last 3 Encounters:  12/12/23 242 lb (109.8 kg)  05/30/23 234 lb 6 oz (106.3 kg)  03/10/23 245 lb 3.2 oz (111.2 kg)    Physical Exam   Gen: WDWN NAD HEENT: NCAT, conjunctiva not injected, sclera nonicteric NECK:  supple, no thyromegaly, no nodes, no carotid bruits  CARDIAC: RRR, S1S2+, no murmur. DP 2+B LUNGS: CTAB. No wheezes ABDOMEN:  BS+, soft, NTND, No HSM, no masses EXT:  no edema MSK: no gross abnormalities.  NEURO: A&O x3.  CN II-XII intact.  PSYCH: normal mood. Good eye contact  Diabetic Foot Exam - Simple   Simple Foot Form Diabetic Foot exam was performed with the following findings: Yes 12/12/2023  8:22 AM  Visual Inspection No deformities, no ulcerations, no other skin breakdown bilaterally: Yes Sensation Testing Intact to touch and monofilament testing bilaterally: Yes Pulse Check Posterior Tibialis and Dorsalis pulse intact bilaterally: Yes Comments         Assessment & Plan:  Type 2 diabetes mellitus without complication, without long-term current use of insulin (HCC) Assessment & Plan: Chronic.  Controlled.  Continue ozempic 2mg  weekly, diet  exercise.    Orders: -     Comprehensive metabolic panel -     Hemoglobin A1c  Essential hypertension Assessment & Plan: Chronic.  Controlled.  Continue amlodipine 10mg ,carvedilol 25mg  bid  Orders: -     CBC with Differential/Platelet  Hyperlipidemia associated with type 2 diabetes mellitus (HCC) Assessment & Plan: Chronic.  Controlled.  Continue crestor 20mg  daily  Orders: -     Comprehensive metabolic panel -     Lipid panel  Gastroesophageal reflux disease without esophagitis Assessment & Plan: Chronic.  Controlled.  Continue omeprazole 20mg  bid   GAD (generalized anxiety disorder) Assessment & Plan: Chronic.  Controlled on lexapro 20mg  daily   Other male erectile dysfunction Assessment & Plan: Chronic.  Not controlled w/viagra 100mg .  Advised against 200mg .  Will change to Cialis 20mg   Orders: -     Tadalafil; Take 0.5-1 tablets (10-20 mg total) by mouth every other day as needed for erectile dysfunction.  Dispense: 30 tablet; Refill: 3  Long-term current use of injectable noninsulin antidiabetic medication  Need for influenza vaccination -     Flu vaccine trivalent PF, 6mos and older(Flulaval,Afluria,Fluarix,Fluzone)  Need for pneumococcal vaccination -     Pneumococcal conjugate vaccine 20-valent    Return in about 6 months (around 06/10/2024) for HTN, cholesterol, DM.  Angelena Sole, MD

## 2023-12-12 NOTE — Assessment & Plan Note (Signed)
Chronic.  Not controlled w/viagra 100mg .  Advised against 200mg .  Will change to Cialis 20mg 

## 2023-12-12 NOTE — Patient Instructions (Signed)

## 2023-12-12 NOTE — Assessment & Plan Note (Signed)
Chronic.  Controlled.  Continue amlodipine 10mg ,carvedilol 25mg  bid

## 2023-12-12 NOTE — Progress Notes (Signed)
Labs great except potassium a little low-he can either take OTC potassium vitamin and eat more potassium foods or send rx for daily #90/1 if he'd prefer

## 2024-05-12 ENCOUNTER — Other Ambulatory Visit: Payer: Self-pay | Admitting: Family Medicine

## 2024-05-23 ENCOUNTER — Other Ambulatory Visit: Payer: Self-pay | Admitting: Family Medicine

## 2024-05-23 DIAGNOSIS — E876 Hypokalemia: Secondary | ICD-10-CM

## 2024-05-26 ENCOUNTER — Other Ambulatory Visit: Payer: Self-pay | Admitting: Family Medicine

## 2024-05-26 ENCOUNTER — Other Ambulatory Visit: Payer: Self-pay | Admitting: Family

## 2024-05-26 DIAGNOSIS — I10 Essential (primary) hypertension: Secondary | ICD-10-CM

## 2024-06-17 ENCOUNTER — Ambulatory Visit: Admitting: Family Medicine

## 2024-06-17 ENCOUNTER — Encounter: Payer: Self-pay | Admitting: Family Medicine

## 2024-06-17 VITALS — BP 126/80 | HR 96 | Temp 98.2°F | Resp 18 | Ht 72.0 in | Wt 250.1 lb

## 2024-06-17 DIAGNOSIS — E119 Type 2 diabetes mellitus without complications: Secondary | ICD-10-CM

## 2024-06-17 DIAGNOSIS — Z7985 Long-term (current) use of injectable non-insulin antidiabetic drugs: Secondary | ICD-10-CM

## 2024-06-17 DIAGNOSIS — E1169 Type 2 diabetes mellitus with other specified complication: Secondary | ICD-10-CM | POA: Diagnosis not present

## 2024-06-17 DIAGNOSIS — I10 Essential (primary) hypertension: Secondary | ICD-10-CM | POA: Diagnosis not present

## 2024-06-17 DIAGNOSIS — E876 Hypokalemia: Secondary | ICD-10-CM

## 2024-06-17 DIAGNOSIS — E785 Hyperlipidemia, unspecified: Secondary | ICD-10-CM | POA: Diagnosis not present

## 2024-06-17 DIAGNOSIS — F411 Generalized anxiety disorder: Secondary | ICD-10-CM

## 2024-06-17 DIAGNOSIS — N528 Other male erectile dysfunction: Secondary | ICD-10-CM | POA: Diagnosis not present

## 2024-06-17 DIAGNOSIS — K219 Gastro-esophageal reflux disease without esophagitis: Secondary | ICD-10-CM

## 2024-06-17 DIAGNOSIS — Z125 Encounter for screening for malignant neoplasm of prostate: Secondary | ICD-10-CM | POA: Diagnosis not present

## 2024-06-17 DIAGNOSIS — Z122 Encounter for screening for malignant neoplasm of respiratory organs: Secondary | ICD-10-CM

## 2024-06-17 LAB — COMPREHENSIVE METABOLIC PANEL WITH GFR
ALT: 23 U/L (ref 0–53)
AST: 20 U/L (ref 0–37)
Albumin: 4.3 g/dL (ref 3.5–5.2)
Alkaline Phosphatase: 73 U/L (ref 39–117)
BUN: 8 mg/dL (ref 6–23)
CO2: 21 meq/L (ref 19–32)
Calcium: 9.6 mg/dL (ref 8.4–10.5)
Chloride: 96 meq/L (ref 96–112)
Creatinine, Ser: 0.73 mg/dL (ref 0.40–1.50)
GFR: 97.04 mL/min (ref >60.00)
Glucose, Bld: 91 mg/dL (ref 70–99)
Potassium: 4.1 meq/L (ref 3.5–5.1)
Sodium: 133 meq/L — ABNORMAL LOW (ref 135–145)
Total Bilirubin: 0.6 mg/dL (ref 0.2–1.2)
Total Protein: 7.2 g/dL (ref 6.0–8.3)

## 2024-06-17 LAB — MICROALBUMIN / CREATININE URINE RATIO
Creatinine,U: 16.9 mg/dL
Microalb Creat Ratio: 81.4 mg/g — ABNORMAL HIGH (ref 0.0–30.0)
Microalb, Ur: 1.4 mg/dL (ref 0.0–1.9)

## 2024-06-17 LAB — CBC WITH DIFFERENTIAL/PLATELET
Basophils Absolute: 0 K/uL (ref 0.0–0.1)
Basophils Relative: 0.5 % (ref 0.0–3.0)
Eosinophils Absolute: 0.1 K/uL (ref 0.0–0.7)
Eosinophils Relative: 1.4 % (ref 0.0–5.0)
HCT: 43.4 % (ref 39.0–52.0)
Hemoglobin: 14.7 g/dL (ref 13.0–17.0)
Lymphocytes Relative: 13.7 % (ref 12.0–46.0)
Lymphs Abs: 1.1 K/uL (ref 0.7–4.0)
MCHC: 34 g/dL (ref 30.0–36.0)
MCV: 91.9 fl (ref 78.0–100.0)
Monocytes Absolute: 0.6 K/uL (ref 0.1–1.0)
Monocytes Relative: 8.2 % (ref 3.0–12.0)
Neutro Abs: 5.9 K/uL (ref 1.4–7.7)
Neutrophils Relative %: 76.2 % (ref 43.0–77.0)
Platelets: 282 K/uL (ref 150.0–400.0)
RBC: 4.72 Mil/uL (ref 4.22–5.81)
RDW: 12.5 % (ref 11.5–15.5)
WBC: 7.8 K/uL (ref 4.0–10.5)

## 2024-06-17 LAB — LIPID PANEL
Cholesterol: 128 mg/dL (ref 0–200)
HDL: 48.1 mg/dL (ref >39.00)
LDL Cholesterol: 56 mg/dL (ref 0–99)
NonHDL: 80.32
Total CHOL/HDL Ratio: 3
Triglycerides: 124 mg/dL (ref 0.0–149.0)
VLDL: 24.8 mg/dL (ref 0.0–40.0)

## 2024-06-17 LAB — PSA: PSA: 0.59 ng/mL (ref 0.10–4.00)

## 2024-06-17 LAB — HEMOGLOBIN A1C: Hgb A1c MFr Bld: 6 % (ref 4.6–6.5)

## 2024-06-17 MED ORDER — CARVEDILOL 25 MG PO TABS: 25.0000 mg | ORAL_TABLET | Freq: Two times a day (BID) | ORAL | 3 refills | Status: AC

## 2024-06-17 MED ORDER — NALTREXONE HCL 50 MG PO TABS: 50.0000 mg | ORAL_TABLET | Freq: Every day | ORAL | 2 refills | Status: DC

## 2024-06-17 MED ORDER — OZEMPIC (2 MG/DOSE) 8 MG/3ML ~~LOC~~ SOPN: 2.0000 mg | PEN_INJECTOR | SUBCUTANEOUS | 3 refills | Status: AC

## 2024-06-17 MED ORDER — OMEPRAZOLE 20 MG PO CPDR: 20.0000 mg | DELAYED_RELEASE_CAPSULE | Freq: Every day | ORAL | 3 refills | Status: AC

## 2024-06-17 MED ORDER — AMLODIPINE BESYLATE 10 MG PO TABS: ORAL_TABLET | ORAL | 3 refills | Status: AC

## 2024-06-17 MED ORDER — ROSUVASTATIN CALCIUM 20 MG PO TABS: 20.0000 mg | ORAL_TABLET | Freq: Every day | ORAL | 3 refills | Status: AC

## 2024-06-17 MED ORDER — ESCITALOPRAM OXALATE 20 MG PO TABS: 20.0000 mg | ORAL_TABLET | Freq: Every day | ORAL | 1 refills | Status: AC

## 2024-06-17 MED ORDER — POTASSIUM CHLORIDE ER 10 MEQ PO TBCR: 10.0000 meq | EXTENDED_RELEASE_TABLET | Freq: Every day | ORAL | 0 refills | Status: AC

## 2024-06-17 NOTE — Progress Notes (Signed)
 Subjective:     Patient ID: Dustin Black, male    DOB: [birth year], 63 y.o.   MRN: [id number]  Chief Complaint  Patient presents with   Medical Management of Chronic Issues    6 month follow-up on htn, cholesterol, dm    HPI Discussed the use of AI scribe software for clinical note transcription with the patient, who gave verbal consent to proceed.  History of Present Illness Dustin Black is a 63 year old male who presents to discuss increased alcohol consumption due to stress.  He has increased his alcohol consumption to five to six drinks per day, attributing this to stress at work, particularly due to the need to reduce staff. He is concerned about developing alcohol dependency and is interested in medication to help reduce cravings. No thoughts of suicide, but he has been experiencing difficulty sleeping.  He is currently taking Lexapro  20 mg daily. He reports that he normally handles stress well, but recent increased stress has been challenging. He denies feeling particularly anxious but acknowledges that stress might be contributing to his increased alcohol use.  For diabetes management, he is on semaglutide  (Ozempic ) 2 mg once a week, which he feels is effective. He notes some weight gain, attributing it to a lack of exercise.  His hypertension is managed with amlodipine  10mg  daily and carvedilol  25 mg twice a day. He reports normal blood pressure readings at home, around 130/80s, but notes elevated readings during office visits.  He takes rosuvastatin  20 mg daily for hyperlipidemia and omeprazole  20 mg daily for reflux, which is well controlled with no significant breakthrough symptoms.  No headaches, dizziness, chest pain, shortness of breath, coughing, wheezing, vomiting, diarrhea, constipation, or urinary issues. Tadalafil  is working better than previous medications for erectile dysfunction.  He has a history of smoking but quit years ago, with a more than 30 pack-year  history.    Health Maintenance Due  Topic Date Due   Diabetic kidney evaluation - Urine ACR  Never done   Lung Cancer Screening  05/01/2020   HEMOGLOBIN A1C  06/10/2024   INFLUENZA VACCINE  06/11/2024    Past Medical History:  Diagnosis Date   Anxiety    Depression    DM (diabetes mellitus) (HCC)    GERD (gastroesophageal reflux disease)    History of chickenpox    Hyperlipidemia    Hypertension    Thyroid  disease 01/23/2014   I hade a module and it was to small to test. My sister had thyroid  cancer.    Past Surgical History:  Procedure Laterality Date   ESOPHAGEAL DILATION       Current Outpatient Medications:    aspirin 81 MG tablet, Take 81 mg by mouth daily., Disp: , Rfl:    diclofenac  (VOLTAREN ) 75 MG EC tablet, Take 1 tablet (75 mg total) by mouth 2 (two) times daily., Disp: 30 tablet, Rfl: 0   naltrexone  (DEPADE) 50 MG tablet, Take 1 tablet (50 mg total) by mouth daily., Disp: 30 tablet, Rfl: 2   tadalafil  (CIALIS ) 20 MG tablet, Take 0.5-1 tablets (10-20 mg total) by mouth every other day as needed for erectile dysfunction., Disp: 30 tablet, Rfl: 3   amLODipine  (NORVASC ) 10 MG tablet, TAKE 1 TABLET BY MOUTH EVERY DAY, Disp: 90 tablet, Rfl: 3   carvedilol  (COREG ) 25 MG tablet, Take 1 tablet (25 mg total) by mouth 2 (two) times daily with a meal., Disp: 180 tablet, Rfl: 3   escitalopram  (LEXAPRO ) 20 MG tablet, Take  1 tablet (20 mg total) by mouth daily., Disp: 90 tablet, Rfl: 1   omeprazole  (PRILOSEC ) 20 MG capsule, Take 1 capsule (20 mg total) by mouth daily., Disp: 90 capsule, Rfl: 3   potassium chloride  (KLOR-CON ) 10 MEQ tablet, Take 1 tablet (10 mEq total) by mouth daily., Disp: 90 tablet, Rfl: 0   rosuvastatin  (CRESTOR ) 20 MG tablet, Take 1 tablet (20 mg total) by mouth daily., Disp: 90 tablet, Rfl: 3   Semaglutide , 2 MG/DOSE, (OZEMPIC , 2 MG/DOSE,) 8 MG/3ML SOPN, Inject 2 mg into the skin once a week., Disp: 9 mL, Rfl: 3  No Known Allergies ROS  neg/noncontributory except as noted HPI/below      Objective:     BP 126/80 (BP Location: Left Arm, Patient Position: Sitting, Cuff Size: Large)   Pulse 96   Temp 98.2 F (36.8 C) (Temporal)   Resp 18   Ht 6' (1.829 m)   Wt 250 lb 2 oz (113.5 kg)   SpO2 97%   BMI 33.92 kg/m  Wt Readings from Last 3 Encounters:  06/17/24 250 lb 2 oz (113.5 kg)  12/12/23 242 lb (109.8 kg)  05/30/23 234 lb 6 oz (106.3 kg)    Physical Exam   Gen: WDWN NAD HEENT: NCAT, conjunctiva not injected, sclera nonicteric NECK:  supple, no thyromegaly, no nodes, no carotid bruits CARDIAC: RRR, S1S2+, no murmur. DP 2+B LUNGS: CTAB. No wheezes ABDOMEN:  BS+, soft, NTND, No HSM, no masses EXT:  no edema MSK: no gross abnormalities.  NEURO: A&O x3.  CN II-XII intact.  PSYCH: normal mood. Good eye contact     Assessment & Plan:  Type 2 diabetes mellitus without complication, without long-term current use of insulin (HCC) -     Comprehensive metabolic panel with GFR -     Hemoglobin A1c -     Ozempic  (2 MG/DOSE); Inject 2 mg into the skin once a week.  Dispense: 9 mL; Refill: 3  Essential hypertension -     Comprehensive metabolic panel with GFR -     CBC with Differential/Platelet -     amLODIPine  Besylate; TAKE 1 TABLET BY MOUTH EVERY DAY  Dispense: 90 tablet; Refill: 3 -     Carvedilol ; Take 1 tablet (25 mg total) by mouth 2 (two) times daily with a meal.  Dispense: 180 tablet; Refill: 3  Hyperlipidemia associated with type 2 diabetes mellitus (HCC) -     Microalbumin / creatinine urine ratio -     Comprehensive metabolic panel with GFR -     Lipid panel -     Rosuvastatin  Calcium ; Take 1 tablet (20 mg total) by mouth daily.  Dispense: 90 tablet; Refill: 3  Gastroesophageal reflux disease without esophagitis -     Omeprazole ; Take 1 capsule (20 mg total) by mouth daily.  Dispense: 90 capsule; Refill: 3  Other male erectile dysfunction  GAD (generalized anxiety disorder) -     Escitalopram   Oxalate; Take 1 tablet (20 mg total) by mouth daily.  Dispense: 90 tablet; Refill: 1  Screening for prostate cancer -     PSA  Low serum potassium level -     Potassium Chloride  ER; Take 1 tablet (10 mEq total) by mouth daily.  Dispense: 90 tablet; Refill: 0  Other orders -     Naltrexone  HCl; Take 1 tablet (50 mg total) by mouth daily.  Dispense: 30 tablet; Refill: 2  Assessment and Plan Assessment & Plan Alcohol use disorder   He has increased  his alcohol consumption to 5-6 drinks per day due to work-related stress, though he reports no suicidal thoughts. There is a discussion on whether to treat anxiety or alcohol use disorder first. Naltrexone  is considered to reduce cravings, and he is concerned about alcohol dependence and interested in addressing the issue. Prescribe naltrexone  once daily to reduce alcohol cravings. Schedule a follow-up in 1-2 months to assess progress with naltrexone . Discuss the potential need for counseling or support groups if medication alone is insufficient.  Type 2 diabetes mellitus   His diabetes was previously well-controlled with semaglutide  (Ozempic ) 2 mg once weekly. Recent weight gain may be due to increased alcohol consumption and lack of exercise. Maintain the current diabetes medication regimen to avoid multiple simultaneous changes. Continue semaglutide  (Ozempic ) 2 mg once weekly. Discuss a potential switch to Mounjaro at the next visit if needed.  Hypertension   Blood pressure is elevated in the office but reported as normal at home (approximately 130/80s). He is currently on amlodipine  and carvedilol . Continue amlodipine  5 mg daily and carvedilol  25 mg twice daily. Encourage relaxation techniques before blood pressure measurements in the office.  Hyperlipidemia   He is currently treated with rosuvastatin  20 mg daily. Continue rosuvastatin  20 mg daily.  Gastroesophageal reflux disease (GERD)   GERD symptoms are well-controlled with omeprazole  20 mg  daily, with occasional food sticking but significant improvement. Continue omeprazole  20 mg daily.  Obesity   Weight gain is noted, potentially related to increased alcohol consumption and decreased exercise.  Erectile dysfunction   Managed with tadalafil , which is effective. No need for medication renewal at this time as he has a sufficient supply. Continue tadalafil  as needed.  Depression /anxiety Currently managed with Lexapro  20 mg daily. He experiences increased stress due to work but reports no significant change in depression symptoms and no suicidal ideation. Continue Lexapro  20 mg daily.    Return in about 6 months (around 12/18/2024) for chronic follow-up-69mo and 1 mo for dm and naltrexone .  Jenkins CHRISTELLA Carrel, MD

## 2024-06-17 NOTE — Patient Instructions (Signed)

## 2024-06-18 ENCOUNTER — Ambulatory Visit: Payer: BC Managed Care – PPO | Admitting: Family Medicine

## 2024-06-22 ENCOUNTER — Ambulatory Visit: Payer: Self-pay | Admitting: Family Medicine

## 2024-06-22 NOTE — Progress Notes (Signed)
 Labs look good except  Some protein in urine-will discuss more at appt next month Sodium a little low-probably from the alcohol-so glad you will cut back

## 2024-06-24 ENCOUNTER — Other Ambulatory Visit: Payer: Self-pay | Admitting: *Deleted

## 2024-06-24 ENCOUNTER — Telehealth: Payer: Self-pay | Admitting: *Deleted

## 2024-06-24 DIAGNOSIS — Z87891 Personal history of nicotine dependence: Secondary | ICD-10-CM

## 2024-06-24 DIAGNOSIS — Z122 Encounter for screening for malignant neoplasm of respiratory organs: Secondary | ICD-10-CM

## 2024-06-24 NOTE — Telephone Encounter (Signed)
 Lung Cancer Screening Narrative/Criteria Questionnaire (Cigarette Smokers Only- No Cigars/Pipes/vapes)   Dustin Black   SDMV:06/30/24 9:00- [name]                                           1961-04-02              LDCT: 07/02/24 6:30 am- [facility]    63 y.o.   Phone: [phone]  Lung Screening Narrative (confirm age 75-77 yrs Medicare / 50-80 yrs Private pay insurance)   Insurance information:[employer]   Referring Provider:Kulik   This screening involves an initial phone call with a team member from our program. It is called a shared decision making visit. The initial meeting is required by insurance and Medicare to make sure you understand the program. This appointment takes about 15-20 minutes to complete. The CT scan will completed at a separate date/time. This scan takes about 5-10 minutes to complete and you may eat and drink before and after the scan.  Criteria questions for Lung Cancer Screening:   Are you a current or former smoker? Former Age began smoking: 18   If you are a former smoker, what year did you quit smoking? 2015(within 15 yrs)   To calculate your smoking history, I need an accurate estimate of how many packs of cigarettes you smoked per day and for how many years. (Not just the number of PPD you are now smoking)   Years smoking 35 x Packs per day 1 = Pack years 35   (at least 20 pack yrs)   (Make sure they understand that we need to know how much they have smoked in the past, not just the number of PPD they are smoking now)  Do you have a personal history of cancer?  No    Do you have a family history of cancer? Yes  (cancer type and and relative) Father (bladder)  Are you coughing up blood?  No  Have you had unexplained weight loss of 15 lbs or more in the last 6 months? No  It looks like you meet all criteria.     Additional information: N/A

## 2024-06-30 ENCOUNTER — Encounter: Payer: Self-pay | Admitting: Adult Health

## 2024-06-30 ENCOUNTER — Ambulatory Visit: Admitting: Adult Health

## 2024-06-30 DIAGNOSIS — Z87891 Personal history of nicotine dependence: Secondary | ICD-10-CM | POA: Diagnosis not present

## 2024-06-30 NOTE — Progress Notes (Signed)
  Virtual Visit via Telephone Note  I connected with Torrell Krutz , 06/30/24 9:14 AM by a telemedicine application and verified that I am speaking with the correct person using two identifiers.  Location: Patient: home Provider: home   I discussed the limitations of evaluation and management by telemedicine and the availability of in person appointments. The patient expressed understanding and agreed to proceed.   Shared Decision Making Visit Lung Cancer Screening Program [phone])   Eligibility: 63 y.o. Pack Years Smoking History Calculation = 35 pack year  (# packs/per year x # years smoked) Recent History of coughing up blood  no Unexplained weight loss? no ( >Than 15 pounds within the last 6 months ) Prior History Lung / other cancer no (Diagnosis within the last 5 years already requiring surveillance chest CT Scans). Smoking Status Former Smoker Former Smokers: Years since quit: 10 years  Quit Date: 2015  Visit Components: Discussion included one or more decision making aids. YES Discussion included risk/benefits of screening. YES Discussion included potential follow up diagnostic testing for abnormal scans. YES Discussion included meaning and risk of over diagnosis. YES Discussion included meaning and risk of False Positives. YES Discussion included meaning of total radiation exposure. YES  Counseling Included: Importance of adherence to annual lung cancer LDCT screening. YES Impact of comorbidities on ability to participate in the program. YES Ability and willingness to under diagnostic treatment. YES  Smoking Cessation Counseling: Former Smokers:  Discussed the importance of maintaining cigarette abstinence. yes Diagnosis Code: Personal History of Nicotine Dependence. S12.108 Information about tobacco cessation classes and interventions provided to patient. Yes Patient provided with ticket for LDCT Scan. yes Written Order for Lung Cancer Screening with LDCT  placed in Epic. Yes (CT Chest Lung Cancer Screening Low Dose W/O CM) [id number]  Z12.2-Screening of respiratory organs Z87.891-Personal history of nicotine dependence   Lamarr Myers 06/30/24

## 2024-06-30 NOTE — Patient Instructions (Signed)

## 2024-07-02 ENCOUNTER — Ambulatory Visit (HOSPITAL_BASED_OUTPATIENT_CLINIC_OR_DEPARTMENT_OTHER)
Admission: RE | Admit: 2024-07-02 | Discharge: 2024-07-02 | Disposition: A | Discharge status: Home / Self Care | Source: Ambulatory Visit | Attending: Acute Care | Admitting: Acute Care

## 2024-07-02 DIAGNOSIS — Z87891 Personal history of nicotine dependence: Secondary | ICD-10-CM | POA: Diagnosis not present

## 2024-07-02 DIAGNOSIS — Z122 Encounter for screening for malignant neoplasm of respiratory organs: Secondary | ICD-10-CM | POA: Diagnosis not present

## 2024-07-14 DIAGNOSIS — H2513 Age-related nuclear cataract, bilateral: Secondary | ICD-10-CM | POA: Diagnosis not present

## 2024-07-14 DIAGNOSIS — E119 Type 2 diabetes mellitus without complications: Secondary | ICD-10-CM | POA: Diagnosis not present

## 2024-07-14 DIAGNOSIS — Z79899 Other long term (current) drug therapy: Secondary | ICD-10-CM | POA: Diagnosis not present

## 2024-07-14 LAB — OPHTHALMOLOGY REPORT-SCANNED

## 2024-07-19 ENCOUNTER — Encounter: Payer: Self-pay | Admitting: Family Medicine

## 2024-07-19 ENCOUNTER — Ambulatory Visit (INDEPENDENT_AMBULATORY_CARE_PROVIDER_SITE_OTHER): Admitting: Family Medicine

## 2024-07-19 ENCOUNTER — Other Ambulatory Visit: Payer: Self-pay | Admitting: Acute Care

## 2024-07-19 VITALS — BP 128/62 | HR 106 | Temp 98.0°F | Ht 72.0 in | Wt 249.8 lb

## 2024-07-19 DIAGNOSIS — R809 Proteinuria, unspecified: Secondary | ICD-10-CM | POA: Insufficient documentation

## 2024-07-19 DIAGNOSIS — F5101 Primary insomnia: Secondary | ICD-10-CM

## 2024-07-19 DIAGNOSIS — Z122 Encounter for screening for malignant neoplasm of respiratory organs: Secondary | ICD-10-CM

## 2024-07-19 DIAGNOSIS — I10 Essential (primary) hypertension: Secondary | ICD-10-CM | POA: Diagnosis not present

## 2024-07-19 DIAGNOSIS — Z87891 Personal history of nicotine dependence: Secondary | ICD-10-CM

## 2024-07-19 MED ORDER — TRAZODONE HCL 50 MG PO TABS: 25.0000 mg | ORAL_TABLET | Freq: Every evening | ORAL | 3 refills | Status: DC | PRN

## 2024-07-19 MED ORDER — LOSARTAN POTASSIUM 25 MG PO TABS: 12.5000 mg | ORAL_TABLET | Freq: Every day | ORAL | 1 refills | Status: AC

## 2024-07-19 NOTE — Progress Notes (Signed)
 Subjective:     Patient ID: Dustin Black, male    DOB: 1961-07-24, 63 y.o.   MRN: [id number]  Chief Complaint  Patient presents with   Follow-up    Follow up on stop drinking alcohol he has cut back some     HPI Discussed the use of AI scribe software for clinical note transcription with the patient, who gave verbal consent to proceed.  History of Present Illness Dustin Black is a 63 year old male with hypertension and proteinuria who presents for follow-up on blood pressure, proteinuria, and alcohol consumption.  He is monitoring his blood pressure at home, with readings generally ranging from 120/80 mmHg to 130/80 mmHg. His heart rate is typically around 90 bpm.  He is actively working on reducing his alcohol intake and reports significant improvement, having consumed only one beer over the past weekend. He attributes this progress to naltrexone , which he believes is helping manage his cravings. Despite his wife's occasional comments on his drinking habits, he did not drink even in her absence, which he views as a positive change.  He experiences difficulty with sleep, particularly with falling asleep, often not sleeping until 2 AM. He denies drinking caffeine in the afternoon and has been trying over-the-counter sleep aids without success. He acknowledges not exercising recently, which he plans to address.  He has a history of smoking, and a recent CT scan revealed a small amount of emphysema. No current respiratory symptoms such as difficulty breathing or persistent cough. He denies chest pain or shortness of breath.  He is currently taking naltrexone  for alcohol use, Ozempic  for diabetes, and a cholesterol medication. He also takes aspirin regularly.    Health Maintenance Due  Topic Date Due   Influenza Vaccine  06/11/2024   OPHTHALMOLOGY EXAM  06/19/2024    Past Medical History:  Diagnosis Date   Anxiety    Depression    DM (diabetes mellitus) (HCC)    GERD  (gastroesophageal reflux disease)    History of chickenpox    Hyperlipidemia    Hypertension    Thyroid  disease 01/23/2014   I hade a module and it was to small to test. My sister had thyroid  cancer.    Past Surgical History:  Procedure Laterality Date   ESOPHAGEAL DILATION       Current Outpatient Medications:    losartan  (COZAAR ) 25 MG tablet, Take 0.5 tablets (12.5 mg total) by mouth daily., Disp: 45 tablet, Rfl: 1   traZODone  (DESYREL ) 50 MG tablet, Take 0.5-1 tablets (25-50 mg total) by mouth at bedtime as needed for sleep., Disp: 30 tablet, Rfl: 3   amLODipine  (NORVASC ) 10 MG tablet, TAKE 1 TABLET BY MOUTH EVERY DAY, Disp: 90 tablet, Rfl: 3   aspirin 81 MG tablet, Take 81 mg by mouth daily., Disp: , Rfl:    carvedilol  (COREG ) 25 MG tablet, Take 1 tablet (25 mg total) by mouth 2 (two) times daily with a meal., Disp: 180 tablet, Rfl: 3   diclofenac  (VOLTAREN ) 75 MG EC tablet, Take 1 tablet (75 mg total) by mouth 2 (two) times daily., Disp: 30 tablet, Rfl: 0   escitalopram  (LEXAPRO ) 20 MG tablet, Take 1 tablet (20 mg total) by mouth daily., Disp: 90 tablet, Rfl: 1   naltrexone  (DEPADE) 50 MG tablet, Take 1 tablet (50 mg total) by mouth daily., Disp: 30 tablet, Rfl: 2   omeprazole  (PRILOSEC ) 20 MG capsule, Take 1 capsule (20 mg total) by mouth daily., Disp: 90 capsule, Rfl: 3  potassium chloride  (KLOR-CON ) 10 MEQ tablet, Take 1 tablet (10 mEq total) by mouth daily., Disp: 90 tablet, Rfl: 0   rosuvastatin  (CRESTOR ) 20 MG tablet, Take 1 tablet (20 mg total) by mouth daily., Disp: 90 tablet, Rfl: 3   Semaglutide , 2 MG/DOSE, (OZEMPIC , 2 MG/DOSE,) 8 MG/3ML SOPN, Inject 2 mg into the skin once a week., Disp: 9 mL, Rfl: 3   tadalafil  (CIALIS ) 20 MG tablet, Take 0.5-1 tablets (10-20 mg total) by mouth every other day as needed for erectile dysfunction., Disp: 30 tablet, Rfl: 3  No Known Allergies ROS neg/noncontributory except as noted HPI/below      Objective:     BP 128/62 (BP  Location: Left Arm, Patient Position: Sitting, Cuff Size: Large)   Pulse (!) 106   Temp 98 F (36.7 C) (Temporal)   Ht 6' (1.829 m)   Wt 249 lb 12.8 oz (113.3 kg)   SpO2 98%   BMI 33.88 kg/m  Wt Readings from Last 3 Encounters:  07/19/24 249 lb 12.8 oz (113.3 kg)  06/17/24 250 lb 2 oz (113.5 kg)  12/12/23 242 lb (109.8 kg)    Physical Exam   Gen: WDWN NAD HEENT: NCAT, conjunctiva not injected, sclera nonicteric CARDIAC: RRR, S1S2+, no murmur. MSK: no gross abnormalities.  NEURO: A&O x3.  CN II-XII intact.  PSYCH: normal mood. Good eye contact  Reviewed labs and CT     Assessment & Plan:  Primary insomnia  Essential hypertension -     Basic metabolic panel with GFR; Future  Microalbuminuria -     Losartan  Potassium; Take 0.5 tablets (12.5 mg total) by mouth daily.  Dispense: 45 tablet; Refill: 1 -     Basic metabolic panel with GFR; Future  Other orders -     traZODone  HCl; Take 0.5-1 tablets (25-50 mg total) by mouth at bedtime as needed for sleep.  Dispense: 30 tablet; Refill: 3  Assessment and Plan Assessment & Plan Essential hypertension with proteinuria (chronic kidney disease risk)   Blood pressure is well-controlled at home, averaging 130/80 mmHg, but elevated in the office. Proteinuria indicates chronic kidney disease risk. Current management includes Ozempic  for renal protection. Start losartan  at a low dose for kidney protection-12.5mg . Schedule non-fasting blood work in one month to monitor kidney function. Monitor blood pressure at home and report if it becomes too low.  Alcohol use disorder in remission with naltrexone    Alcohol use disorder is in remission with naltrexone . He reports a significant reduction in alcohol consumption and is motivated to continue the medication. Continue naltrexone  for at least three more months. Consider tapering off naltrexone  after three months if stable, starting with half a tablet daily for a couple of weeks before stopping.  Communicate any issues or decisions regarding naltrexone  use.  Primary insomnia (difficulty falling asleep)   Experiencing difficulty falling asleep with occasional awakenings. Over-the-counter sleep aids have been ineffective. Start trazodone  50mg  at night for sleep. Implement sleep hygiene practices, including screen-off time 30 minutes before bed and using the bedroom only for sleep. Increase physical activity to aid sleep. Report on the effectiveness and any side effects of trazodone .  Emphysema, mild and asymptomatic   Mild emphysema noted on imaging, asymptomatic with no respiratory complaints.  Coronary atherosclerosis, asymptomatic by imaging   Imaging shows coronary atherosclerosis with calcium  buildup in the aorta and coronary arteries. Asymptomatic with no chest pain or shortness of breath. Current management includes cholesterol medication and aspirin. Encourage regular exercise to maintain cardiovascular health. Monitor  for any symptoms such as chest pain or shortness of breath.  Hiatal hernia, asymptomatic   Hiatal hernia identified on imaging, asymptomatic.  General Health Maintenance   Discussed the importance of exercise for overall health and management of existing conditions. Encourage regular physical activity.  Follow-Up   Follow-up plans discussed for ongoing management of conditions and medication adjustments. Keep the scheduled follow-up appointment in February. Communicate any changes or concerns regarding medications or symptoms.    Return in about 4 weeks (around 08/16/2024) for labs only    keep appt in Feb.  Jenkins CHRISTELLA Carrel, MD

## 2024-07-19 NOTE — Patient Instructions (Signed)
 It was very nice to see you today!  Start the trazodone  at night for sleep Exercise Start the losartan  for kidney protection Labs in 1 month   PLEASE NOTE:  If you had any lab tests please let us  know if you have not heard back within a few days. You may see your results on MyChart before we have a chance to review them but we will give you a call once they are reviewed by us . If we ordered any referrals today, please let us  know if you have not heard from their office within the next week.   Please try these tips to maintain a healthy lifestyle:  Eat most of your calories during the day when you are active. Eliminate processed foods including packaged sweets (pies, cakes, cookies), reduce intake of potatoes, white bread, white pasta, and white rice. Look for whole grain options, oat flour or almond flour.  Each meal should contain half fruits/vegetables, one quarter protein, and one quarter carbs (no bigger than a computer mouse).  Cut down on sweet beverages. This includes juice, soda, and sweet tea. Also watch fruit intake, though this is a healthier sweet option, it still contains natural sugar! Limit to 3 servings daily.  Drink at least 1 glass of water with each meal and aim for at least 8 glasses per day  Exercise at least 150 minutes every week.

## 2024-08-10 ENCOUNTER — Other Ambulatory Visit: Payer: Self-pay | Admitting: Family Medicine

## 2024-08-10 NOTE — Telephone Encounter (Signed)
 Spoke with patient about medication refill trazodone  per Dr Wendolyn. Pt states doing well on it and feel its working well. Verified 90day supply request and sent.

## 2024-08-14 ENCOUNTER — Other Ambulatory Visit: Payer: Self-pay | Admitting: Family

## 2024-08-14 ENCOUNTER — Other Ambulatory Visit: Payer: Self-pay | Admitting: Family Medicine

## 2024-08-17 ENCOUNTER — Other Ambulatory Visit (INDEPENDENT_AMBULATORY_CARE_PROVIDER_SITE_OTHER)

## 2024-08-17 DIAGNOSIS — R809 Proteinuria, unspecified: Secondary | ICD-10-CM

## 2024-08-17 DIAGNOSIS — I10 Essential (primary) hypertension: Secondary | ICD-10-CM

## 2024-08-17 LAB — BASIC METABOLIC PANEL WITH GFR
BUN: 10 mg/dL (ref 6–23)
CO2: 23 meq/L (ref 19–32)
Calcium: 9.2 mg/dL (ref 8.4–10.5)
Chloride: 100 meq/L (ref 96–112)
Creatinine, Ser: 0.69 mg/dL (ref 0.40–1.50)
GFR: 98.59 mL/min (ref >60.00)
Glucose, Bld: 116 mg/dL — ABNORMAL HIGH (ref 70–99)
Potassium: 3.9 meq/L (ref 3.5–5.1)
Sodium: 134 meq/L — ABNORMAL LOW (ref 135–145)

## 2024-08-22 ENCOUNTER — Ambulatory Visit: Payer: Self-pay | Admitting: Family Medicine

## 2024-08-22 NOTE — Progress Notes (Signed)
 Stable.  Will repeat in Feb

## 2024-12-07 ENCOUNTER — Other Ambulatory Visit: Payer: Self-pay | Admitting: Family

## 2024-12-20 ENCOUNTER — Ambulatory Visit: Admitting: Family Medicine

## 2024-12-28 ENCOUNTER — Ambulatory Visit: Admitting: Family Medicine
